# Patient Record
Sex: Female | Born: 1970 | Hispanic: No | Smoking: Current every day smoker
Health system: Southern US, Community
[De-identification: ages and names within clinical notes are randomized; demographics above are authoritative.]

## PROBLEM LIST (undated history)

## (undated) ENCOUNTER — Ambulatory Visit: Discharge status: Absent without leave | Payer: 59

## (undated) DIAGNOSIS — E079 Disorder of thyroid, unspecified: Secondary | ICD-10-CM

## (undated) DIAGNOSIS — J984 Other disorders of lung: Secondary | ICD-10-CM

## (undated) DIAGNOSIS — M797 Fibromyalgia: Secondary | ICD-10-CM

## (undated) DIAGNOSIS — G35D Multiple sclerosis, unspecified: Secondary | ICD-10-CM

## (undated) DIAGNOSIS — C159 Malignant neoplasm of esophagus, unspecified: Secondary | ICD-10-CM

## (undated) DIAGNOSIS — J449 Chronic obstructive pulmonary disease, unspecified: Secondary | ICD-10-CM

## (undated) DIAGNOSIS — G35 Multiple sclerosis: Secondary | ICD-10-CM

## (undated) DIAGNOSIS — I10 Essential (primary) hypertension: Secondary | ICD-10-CM

## (undated) DIAGNOSIS — E119 Type 2 diabetes mellitus without complications: Secondary | ICD-10-CM

## (undated) DIAGNOSIS — J45909 Unspecified asthma, uncomplicated: Secondary | ICD-10-CM

## (undated) HISTORY — DX: Chronic obstructive pulmonary disease, unspecified: J44.9

## (undated) HISTORY — PX: ABDOMINAL HYSTERECTOMY: SHX81

## (undated) HISTORY — PX: NISSEN FUNDOPLICATION: SHX2091

## (undated) HISTORY — DX: Other disorders of lung: J98.4

## (undated) HISTORY — DX: Unspecified asthma, uncomplicated: J45.909

---

## 2001-05-30 ENCOUNTER — Other Ambulatory Visit: Admission: RE | Admit: 2001-05-30 | Discharge: 2001-05-30 | Payer: Self-pay | Admitting: Obstetrics and Gynecology

## 2004-10-13 ENCOUNTER — Emergency Department: Payer: Self-pay | Admitting: Internal Medicine

## 2006-07-19 ENCOUNTER — Other Ambulatory Visit: Payer: Self-pay

## 2006-07-19 ENCOUNTER — Emergency Department: Payer: Self-pay | Admitting: Internal Medicine

## 2006-07-24 ENCOUNTER — Emergency Department: Payer: Self-pay | Admitting: Unknown Physician Specialty

## 2008-06-14 ENCOUNTER — Emergency Department: Payer: Self-pay | Admitting: Internal Medicine

## 2009-05-02 ENCOUNTER — Emergency Department: Payer: Self-pay | Admitting: Emergency Medicine

## 2010-03-22 ENCOUNTER — Emergency Department: Payer: Self-pay | Admitting: Emergency Medicine

## 2010-09-23 ENCOUNTER — Emergency Department: Payer: Self-pay | Admitting: Emergency Medicine

## 2011-01-05 ENCOUNTER — Emergency Department: Payer: Self-pay | Admitting: Emergency Medicine

## 2011-07-22 ENCOUNTER — Emergency Department: Payer: Self-pay | Admitting: Emergency Medicine

## 2011-09-14 ENCOUNTER — Emergency Department: Payer: Self-pay | Admitting: Emergency Medicine

## 2011-11-05 ENCOUNTER — Emergency Department: Payer: Self-pay | Admitting: Emergency Medicine

## 2011-11-29 ENCOUNTER — Encounter: Payer: Self-pay | Admitting: Obstetrics and Gynecology

## 2011-11-29 LAB — T4, FREE: Free Thyroxine: 1.09 ng/dL (ref 0.76–1.46)

## 2011-12-31 ENCOUNTER — Encounter: Payer: Self-pay | Admitting: Obstetrics and Gynecology

## 2012-03-07 DIAGNOSIS — M797 Fibromyalgia: Secondary | ICD-10-CM | POA: Insufficient documentation

## 2012-03-07 DIAGNOSIS — G43909 Migraine, unspecified, not intractable, without status migrainosus: Secondary | ICD-10-CM | POA: Insufficient documentation

## 2012-03-07 DIAGNOSIS — K589 Irritable bowel syndrome without diarrhea: Secondary | ICD-10-CM | POA: Insufficient documentation

## 2012-03-07 DIAGNOSIS — G35 Multiple sclerosis: Secondary | ICD-10-CM | POA: Insufficient documentation

## 2012-03-07 DIAGNOSIS — K224 Dyskinesia of esophagus: Secondary | ICD-10-CM | POA: Insufficient documentation

## 2012-03-07 DIAGNOSIS — J45909 Unspecified asthma, uncomplicated: Secondary | ICD-10-CM | POA: Insufficient documentation

## 2012-05-10 ENCOUNTER — Observation Stay: Payer: Self-pay | Admitting: Obstetrics and Gynecology

## 2012-05-11 LAB — URINALYSIS, COMPLETE
Nitrite: POSITIVE
Ph: 6 (ref 4.5–8.0)
WBC UR: 7 /HPF (ref 0–5)

## 2012-05-27 DIAGNOSIS — Z98891 History of uterine scar from previous surgery: Secondary | ICD-10-CM | POA: Insufficient documentation

## 2012-06-03 ENCOUNTER — Emergency Department: Payer: Self-pay

## 2012-06-03 LAB — CBC
MCHC: 31.4 g/dL — ABNORMAL LOW (ref 32.0–36.0)
Platelet: 281 10*3/uL (ref 150–440)
RDW: 18 % — ABNORMAL HIGH (ref 11.5–14.5)
WBC: 15.4 10*3/uL — ABNORMAL HIGH (ref 3.6–11.0)

## 2012-06-03 LAB — BASIC METABOLIC PANEL
BUN: 6 mg/dL — ABNORMAL LOW (ref 7–18)
Calcium, Total: 8.6 mg/dL (ref 8.5–10.1)
Co2: 25 mmol/L (ref 21–32)
EGFR (African American): >60
Potassium: 3.1 mmol/L — ABNORMAL LOW (ref 3.5–5.1)

## 2012-06-03 LAB — HEPATIC FUNCTION PANEL A (ARMC)
Albumin: 2.3 g/dL — ABNORMAL LOW (ref 3.4–5.0)
Alkaline Phosphatase: 101 U/L (ref 50–136)
Bilirubin, Direct: <0.1 mg/dL (ref 0.00–0.20)
Bilirubin,Total: 0.2 mg/dL (ref 0.2–1.0)
SGPT (ALT): 13 U/L (ref 12–78)

## 2012-06-03 LAB — PRO B NATRIURETIC PEPTIDE: B-Type Natriuretic Peptide: 185 pg/mL — ABNORMAL HIGH (ref 0–125)

## 2012-06-06 DIAGNOSIS — J449 Chronic obstructive pulmonary disease, unspecified: Secondary | ICD-10-CM | POA: Insufficient documentation

## 2012-06-06 DIAGNOSIS — M549 Dorsalgia, unspecified: Secondary | ICD-10-CM | POA: Insufficient documentation

## 2012-06-06 DIAGNOSIS — D649 Anemia, unspecified: Secondary | ICD-10-CM | POA: Insufficient documentation

## 2012-06-06 DIAGNOSIS — IMO0002 Reserved for concepts with insufficient information to code with codable children: Secondary | ICD-10-CM | POA: Insufficient documentation

## 2012-06-06 DIAGNOSIS — F319 Bipolar disorder, unspecified: Secondary | ICD-10-CM | POA: Insufficient documentation

## 2012-06-06 DIAGNOSIS — R569 Unspecified convulsions: Secondary | ICD-10-CM | POA: Insufficient documentation

## 2012-06-06 DIAGNOSIS — F419 Anxiety disorder, unspecified: Secondary | ICD-10-CM | POA: Insufficient documentation

## 2012-06-09 LAB — CULTURE, BLOOD (SINGLE)

## 2012-07-14 ENCOUNTER — Emergency Department: Payer: Self-pay | Admitting: Emergency Medicine

## 2012-07-24 DIAGNOSIS — I10 Essential (primary) hypertension: Secondary | ICD-10-CM | POA: Insufficient documentation

## 2012-07-27 ENCOUNTER — Emergency Department: Payer: Self-pay | Admitting: Emergency Medicine

## 2012-07-31 LAB — WOUND AEROBIC CULTURE

## 2012-08-06 DIAGNOSIS — E119 Type 2 diabetes mellitus without complications: Secondary | ICD-10-CM | POA: Insufficient documentation

## 2012-08-13 ENCOUNTER — Emergency Department: Payer: Self-pay | Admitting: Emergency Medicine

## 2012-10-06 DIAGNOSIS — Z7689 Persons encountering health services in other specified circumstances: Secondary | ICD-10-CM | POA: Insufficient documentation

## 2012-12-03 DIAGNOSIS — Z79891 Long term (current) use of opiate analgesic: Secondary | ICD-10-CM | POA: Insufficient documentation

## 2013-02-08 ENCOUNTER — Emergency Department: Payer: Self-pay | Admitting: Emergency Medicine

## 2013-02-09 ENCOUNTER — Emergency Department: Payer: Self-pay | Admitting: Emergency Medicine

## 2013-02-09 LAB — CBC
HCT: 33.4 % — ABNORMAL LOW (ref 35.0–47.0)
MCH: 24.5 pg — ABNORMAL LOW (ref 26.0–34.0)
MCHC: 31.6 g/dL — ABNORMAL LOW (ref 32.0–36.0)
Platelet: 287 10*3/uL (ref 150–440)
RDW: 19.6 % — ABNORMAL HIGH (ref 11.5–14.5)

## 2013-02-09 LAB — COMPREHENSIVE METABOLIC PANEL
Albumin: 3.6 g/dL (ref 3.4–5.0)
Alkaline Phosphatase: 46 U/L — ABNORMAL LOW (ref 50–136)
BUN: 7 mg/dL (ref 7–18)
Bilirubin,Total: 0.2 mg/dL (ref 0.2–1.0)
Chloride: 112 mmol/L — ABNORMAL HIGH (ref 98–107)
Creatinine: 0.72 mg/dL (ref 0.60–1.30)
EGFR (African American): >60
EGFR (Non-African Amer.): >60
Osmolality: 279 (ref 275–301)
Potassium: 3.6 mmol/L (ref 3.5–5.1)
SGOT(AST): 20 U/L (ref 15–37)
Sodium: 141 mmol/L (ref 136–145)
Total Protein: 8 g/dL (ref 6.4–8.2)

## 2013-02-12 DIAGNOSIS — Z720 Tobacco use: Secondary | ICD-10-CM | POA: Insufficient documentation

## 2013-02-12 DIAGNOSIS — F1920 Other psychoactive substance dependence, uncomplicated: Secondary | ICD-10-CM | POA: Insufficient documentation

## 2013-03-04 DIAGNOSIS — T7491XA Unspecified adult maltreatment, confirmed, initial encounter: Secondary | ICD-10-CM | POA: Insufficient documentation

## 2013-03-04 DIAGNOSIS — IMO0002 Reserved for concepts with insufficient information to code with codable children: Secondary | ICD-10-CM | POA: Insufficient documentation

## 2013-04-15 ENCOUNTER — Ambulatory Visit: Payer: Self-pay | Admitting: Pain Medicine

## 2013-04-16 ENCOUNTER — Emergency Department: Payer: Self-pay | Admitting: Unknown Physician Specialty

## 2013-11-02 ENCOUNTER — Emergency Department: Payer: Self-pay | Admitting: Emergency Medicine

## 2013-11-03 ENCOUNTER — Ambulatory Visit: Payer: Self-pay | Admitting: Family Medicine

## 2014-08-30 ENCOUNTER — Ambulatory Visit: Payer: Self-pay

## 2014-09-02 ENCOUNTER — Emergency Department: Payer: Self-pay | Admitting: Emergency Medicine

## 2014-09-08 ENCOUNTER — Telehealth: Payer: Self-pay | Admitting: Endocrinology

## 2014-09-08 ENCOUNTER — Ambulatory Visit: Payer: Self-pay | Admitting: Endocrinology

## 2014-09-08 NOTE — Telephone Encounter (Signed)
Follow up advised. Contact patient and schedule visit in _1-2___weeks 

## 2014-09-08 NOTE — Telephone Encounter (Signed)
Patient no showed today's appt. Please advise on how to follow up. °A. No follow up necessary. °B. Follow up urgent. Contact patient immediately. °C. Follow up necessary. Contact patient and schedule visit in ___ days. °D. Follow up advised. Contact patient and schedule visit in ____weeks. ° °

## 2014-09-09 NOTE — Telephone Encounter (Signed)
Unable to reach patient at number provided

## 2014-09-10 NOTE — Telephone Encounter (Signed)
No show letter sent.

## 2014-11-29 ENCOUNTER — Ambulatory Visit: Payer: Self-pay | Admitting: Endocrinology

## 2014-12-06 ENCOUNTER — Ambulatory Visit: Payer: Self-pay | Admitting: Endocrinology

## 2014-12-13 ENCOUNTER — Ambulatory Visit: Payer: Self-pay | Admitting: Physician Assistant

## 2014-12-17 ENCOUNTER — Ambulatory Visit: Payer: Self-pay | Admitting: Registered Nurse

## 2015-01-10 ENCOUNTER — Ambulatory Visit
Admit: 2015-01-10 | Disposition: A | Discharge status: Home / Self Care | Payer: Self-pay | Attending: Family Medicine | Admitting: Family Medicine

## 2015-01-13 ENCOUNTER — Emergency Department
Admit: 2015-01-13 | Disposition: A | Discharge status: Home / Self Care | Payer: Self-pay | Admitting: Emergency Medicine

## 2015-01-30 NOTE — Consult Note (Signed)
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, fibromyalgia ,advancing maternal age, smoker, kidney stones, asthma, cesarean section x 4, h/o gestational diabetes    Referring Physician [facility]; Dr Renford Dills GI at [facility]    Prenatal Hx 44 yo Lacey May [id number] WF was recently re-married and became pregnant u/s on 2/1 =5w 5d -h/o Nissen fundoplication  0240 due to GERD, she has continued esophageal problems  required feeding tube for 6 months 8 years ago when her weight dropped to 70 lbs saw Dr Adria Devon at [facility] in 2000 he diagnosed esophageal spasm versus emotional overlay, he is scheduled to see her in follow up - fibromyalgia used neurontin self d/cd followed by her fam med physician at [facility] -h/o renal stones - it was how she knew she was pregnant , frequent UTI  -asthma uses inhaler -smoker 2-3 /day failed electronic cigarette, nicotine patch and chantix  -cesarean x 4 - advanced maternal age met with gen counselor    Past Obstetrical Hx 1988- breech cesarean healthy female Chlamydia -GDM  1990 repeat cesarean healthy female- GDM  1991 repeat cesarean haelthy female no GDM but baby weighed 9 lb 12 oz 1997 repeat cesarean female Vonna Kotyk - GI problems in nursery continued problems followed by [facility] h/o ectopic and sab   Home Medications: Medication Instructions Status  Zofran ODT 4 mg tablet, disintegrating 1 tab(s) orally 3 times a day, As Needed for nausea Active  Percocet 5/325 325 mg-5 mg tablet 1 tab(s) orally every 6 hours, As Needed for pain Active  albuterol CFC free 90 mcg/inh aerosol  Active  Vicodin tablet 500 mg-5 mg 1 - 2 tab(s) orally every 6 hours as needed   Active  Prenatal Multivitamins oral tablet 1 tab(s) orally once a day Active  Nexium 40 mg oral delayed release capsule 1 cap(s) orally 2 times a day Active   Allergies:   Penicillin: Anaphylaxis  Serzone: Chest Tightness, Tachycardia  Benzoyl Peroxide: Blisters  Milk:  N/V/Diarrhea  beef: GI Distress  Chicken or poultry: GI Distress  Vital Signs/Notes:  Nursing Vital Signs: **Vital Signs.:   25-Mar-13 10:34   Vital Signs Type Routine   Temperature Temperature (F) 99   Celsius 37.2   Temperature Source oral   Pulse Pulse 95   Pulse source per Dinamap   Respirations Respirations 12   Systolic BP Systolic BP 973   Diastolic BP (mmHg) Diastolic BP (mmHg) 63   Mean BP 76   BP Source Dinamap   Perinatal Consult:   PMed Hx Rubella Immune, Hx of varicella    Past Medical History cont'd As above  -GI problems followed by Dr Adria Devon at [facility] last seen 2000- h/o NIssen fundoplication  5329 due to GERD, she has continued esophageal problems requires narcotics vicodin with every meal required feeding tube for 6 months 8 years ago when her weight dropped to 70 lbs , has used Nitroglycerine patch and cardizem in past off now  - h/o HTN off meds normotensive today  -migraines - fibromyalgia used neurontin self d/cd when found out she was pregnant  -h/o renal stones -  frequent UTI  -asthma uses inhaler -smoker 2-3 /day failed electronic cigarette, nicotine patch and chantix  -cesarean x 4 - advanced maternal age met with gen counselor    PSurg Hx c/s x4 , fundoplication    FHx son with multiple problems    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    Soc Hx married,  recent break up got back together seeing  counselor , denies DV   Review Of Systems:   Subjective continued exhaustion , pain with eating, stress related to loss of unemployment benefits    Tolerating Diet No    Medications/Allergies Reviewed Medications/Allergies reviewed   Exam:   Today's Weight 134 up 14    Abdomen soft, FHR 150 , fundus half way to  umbilicus   Impression/Recommendations:   Impression IUP at 14 6/7 by [facility] scan - with several medical issues Serious GI issues with pain with oral intake requiring narcotics, h/o feeding tube- her fam med has  managed her narcotic requirements in past - she has been referred to pain clinic and has an ppt on 01/10/12  Fibromyalgia pain c/o worsening pain - she reports prednisone has helped in past - I rxd a taper pack today 40 mg taper over 2 weeks - neurontin/gabapentin is a reasonable management option if effective for her  Advancing maternal age- desires u/s only  prior cesarean x 4 Smoker h/o HTN h/o GDM    Recommendations I am encouraged by her weight gain ! I gave the patient #14 oxycodone to assist her until her 01/10/12 pain mgt appt- I was clear that I will not  Rx her narcotic maintenance during pregnancy   She will need consultation with neonatology around delivery to discuss opiate withdrawal care for baby  Records from [facility] were not received - I spoke with Dr Adria Devon directly saw her in 2000 and i encouraged pt to contact them about her pregnancy since likely to have increasing problems nitro patch and cardizem may be acceptable if they work for her Neurontin can be used if she decompensates further wrt pain control  follow weight gain, monitor random blood sugar at visits - unlikley to tolerate glucola -  (I did not check today)   Plan:   Genetic Counseling yes, done previously    Prenatal Diagnosis Options First trimester, Level II [place]    Ultrasound at what gestational ages Monthly >24 weeks    Delivery Mode Cesarean, repeat #4    Delivery at what gestational age [redacted] weeks     Total Time Spent with Patient 15 minutes    >50% of visit spent in couseling/coordination of care yes    Office Use Only 99213  Office Visit Level 3 (65mn) EST exp prob focused outpt   Coding Description: MATERNAL CONDITIONS/HISTORY INDICATION(S).   Drug Use:  Opioids/Opiates or Codeine/Methadone.   Diabetes - Gestational GDM.   History of prior preg w/ Macrosomia.   OTHER: GI disorder/fibromyalgia.  Electronic Signatures: LSharyn Creamer(MD)  (Signed 25-Mar-13  12:36)  Authored: Referral, Home Medications, Allergies, Vital Signs/Notes, Consult, Exam, Impression, Plan, Billing, Coding Description   Last Updated: 25-Mar-13 12:36 by LSharyn Creamer(MD)

## 2015-01-30 NOTE — Consult Note (Signed)
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, fibromyalgia ,advancing maternal age, smoker, kidney stones, asthma, cesarean section x 4, h/o gestational diabetes    Referring Physician [facility]; Dr Vicenta Aly GI at [facility]    Prenatal Hx 44 yo [place] [id number] WF was recently re-married and became pregnant u/s on 2/1 =5w 5d -h/o Nissen fundoplication  9432 due to GERD, she has continued esophagealesophageal problems requires narcotics vicodinwith every meal required feeding tube for 6 months 8 years ago when her weight dropped to 70 lbs  - fibromyalgia used neurontin self d/cd -h/o renal stones - it was how she knew she was pregnant , frequent UTI  -asthma uses inhaler -smoker 2-3 /day failed electronic cigarette, nicotine patch and chantix  -cesarean x 4 - advanced maternal age met with gen counselor    Past Obstetrical Hx 1988- breech cesarean healthy female Chlamydia -GDM  1990 repeat cesarean healthy female- GDM  1991 repeat cesarean haelthy female no GDM but baby weighed 9 lb 12 oz 1997 repeat cesarean female Vonna Kotyk - GI problems in nursery continued problems followed by [facility] h/o ectopic and sab   Home Medications: Medication Instructions Status  Zofran ODT 4 mg tablet, disintegrating 1 tab(s) orally 3 times a day, As Needed for nausea Active  Percocet 5/325 325 mg-5 mg tablet 1 tab(s) orally every 6 hours, As Needed for pain Active  albuterol CFC free 90 mcg/inh aerosol  Active  Vicodin tablet 500 mg-5 mg 1 - 2 tab(s) orally every 6 hours as needed   Active  Prenatal Multivitamins oral tablet 1 tab(s) orally once a day Active  Nexium 40 mg oral delayed release capsule 1 cap(s) orally 2 times a day Active   Allergies:   Penicillin: Anaphylaxis  Serzone: Chest Tightness, Tachycardia  Benzoyl Peroxide: Blisters  Milk: N/V/Diarrhea  beef: GI Distress  Chicken or poultry: GI Distress  Vital Signs/Notes:  Nursing Vital Signs: **Vital Signs.:  21-Feb-13 13:06   Vital Signs Type Routine   Temperature Temperature (F) 99.7   Celsius 37.6   Temperature Source oral   Pulse Pulse 83   Pulse source per Dinamap   Respirations Respirations 12   Systolic BP Systolic BP 761   Diastolic BP (mmHg) Diastolic BP (mmHg) 68   Mean BP 85   BP Source Dinamap   Perinatal Consult:   PMed Hx Rubella Immune, Hx of varicella    Past Medical History cont'd As above  -GI problems followed by Dr Myrtis Ser at [facility] - h/o NIssen fundoplication  4709 due to GERD, she has continued esophagealesophageal problems requires narcotics vicodin with every meal required feeding tube for 6 months 8 years ago when her weight dropped to 70 lbs , has used Nitroglycerine patch and cardizem in past off now  - h/o HTN off meds normotensive today  -migraines - fibromyalgia used neurontin self d/cd when found out she was pregnant  -h/o renal stones -  frequent UTI  -asthma uses inhaler -smoker 2-3 /day failed electronic cigarette, nicotine patch and chantix  -cesarean x 4 - advanced maternal age met with gen counselor    PSurg Hx c/s x4 , fundoplication    FHx son with multiple problems    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    Soc Hx married   Review Of Systems:   Subjective exhaustion , pain with eating    Tolerating Diet No   Exam:   Today's Weight 122 lbs  Blood Glucose:  21-Feb-13 15:25    POCT Blood Glucose 84   Impression/Recommendations:   Impression IUP at 8 6/7 by [facility] - Pt expresses strong desire to continue pregnancy despite mutliple problems Serious GI issues with pain with oral intake requiring narcotics, h/o feeding tube- her fam med ahs managed her narcotic requirements Fibromyalgia pain c/o pain desires to restart neurontin Advancing maternal age prior cesarean x 4 Smoker h/o HTN h/o GDM    Recommendations I gave the patient #40 oxycodone and referred her to pain management  I will request records from  [facility] and i encouraged pt to contact them about her pregnancy since likely to have increasing problems nitro patch and cardizem may be acceptable if they work for her pt met with the genetic counselor to discuss meds and Advancing maternal age  refill on Neurontin and instructions on re-starting given follow weight gain, monitor random blood sugar at visits - unlikley to tolerate glucola Obtain baseline p/c and chemistries , EKG (not ordered today)   Plan:   Genetic Counseling yes, done today    Prenatal Diagnosis Options First trimester, Level II [place]    Ultrasound at what gestational ages Monthly >24 weeks    Delivery Mode Cesarean    Additional Testing Thyroid panel    Delivery at what gestational age [redacted] weeks     Total Time Spent with Patient 30 minutes    >50% of visit spent in couseling/coordination of care yes    Office Use Only 99242  Level 2 (24mn) NEW office consult exp prob focused   Coding Description: MATERNAL CONDITIONS/HISTORY INDICATION(S).   Adv. Maternal Age - multigravida.   Drug Use:  Opioids/Opiates or Codeine/Methadone.   Previous C-section.  Electronic Signatures: LSharyn Creamer(MD)  (Signed 21-Feb-13 16:35)  Authored: Referral, Home Medications, Allergies, Vital Signs/Notes, Consult, Exam, Lab, Impression, Plan, Billing, Coding Description   Last Updated: 21-Feb-13 16:35 by LSharyn Creamer(MD)

## 2015-02-02 ENCOUNTER — Ambulatory Visit (INDEPENDENT_AMBULATORY_CARE_PROVIDER_SITE_OTHER): Payer: Self-pay | Admitting: Endocrinology

## 2015-02-02 ENCOUNTER — Encounter: Payer: Self-pay | Admitting: Endocrinology

## 2015-02-02 VITALS — BP 132/86 | HR 84 | Temp 98.1°F | Ht 64.5 in | Wt 119.0 lb

## 2015-02-02 DIAGNOSIS — R002 Palpitations: Secondary | ICD-10-CM | POA: Insufficient documentation

## 2015-02-02 NOTE — Patient Instructions (Signed)
blood tests, and a 24-HR urine test, are requested for you today.  We'll let you know about the results. If these are normal, no further hormonal testing is needed.

## 2015-02-02 NOTE — Progress Notes (Signed)
Subjective:    Patient ID: Lacey May, female    DOB: 1971/09/01, 44 y.o.   MRN: [id number]  HPI Pt states 6 months of moderate palpitations in the chest, and assoc doe.  She has no h/o thyroid problems.  No EtOH or cocaine use. No past medical history on file.  No past surgical history on file.  History   Social History  . Marital Status: Married    Spouse Name: N/A  . Number of Children: N/A  . Years of Education: N/A   Occupational History  . Not on file.   Social History Main Topics  . Smoking status: Unknown If Ever Smoked  . Smokeless tobacco: Not on file  . Alcohol Use: Not on file  . Drug Use: Not on file  . Sexual Activity: Not on file   Other Topics Concern  . Not on file   Social History Narrative  . No narrative on file    No current outpatient prescriptions on file prior to visit.   No current facility-administered medications on file prior to visit.    Allergies  Allergen Reactions  . Penicillins Anaphylaxis    Other reaction(s): SHORTNESS OF BREATH  . Nefazodone Other (See Comments)    Tachacardia Other reaction(s): OTHER  . Benzoyl Peroxide Other (See Comments) and Rash    Family History  Problem Relation Age of Onset  . Adopted: Yes    BP 132/86 mmHg  Pulse 84  Temp(Src) 98.1 F (36.7 C) (Oral)  Ht 5' 4.5" (1.638 m)  Wt 119 lb (53.978 kg)  BMI 20.12 kg/m2  SpO2 97%    Review of Systems She has fatigue, tremor, anxiety, headache, rhinorrhea, easy bruising, urinary frequency, excessive diaphoresis, heat intolerance, and diffuse myalgias.  denies weight change, hoarseness, double vision, diarrhea, and numbness.  She has no menses x 1 year.    Objective:   Physical Exam VS: see vs page GEN: no distress HEAD: head: no deformity eyes: no periorbital swelling, no proptosis external nose and ears are normal mouth: no lesion seen NECK: supple, thyroid is not enlarged CHEST WALL: no deformity LUNGS:  Clear to auscultation.    CV: reg rate and rhythm, no murmur ABD: abdomen is soft, nontender.  no hepatosplenomegaly.  not distended.  no hernia MUSCULOSKELETAL: muscle bulk and strength are grossly normal.  no obvious joint swelling.  gait is normal and steady EXTEMITIES: no deformity.  no ulcer on the feet.  feet are of normal color and temp.  no edema PULSES: dorsalis pedis intact bilat.  no carotid bruit NEURO:  cn 2-12 grossly intact.   readily moves all 4's.  sensation is intact to touch on the feet SKIN:  Normal texture and temperature.  No rash or suspicious lesion is visible.   NODES:  None palpable at the neck PSYCH: alert, well-oriented.  Does not appear anxious nor depressed.    (pt says LH was 70 last year). Lab Results  Component Value Date   TSH 0.32* 02/02/2015  free T4 is slightly low also.     Assessment & Plan:  Palpitations, new, uncertain etiology. Several mild abnormalities of TFT, uncertain etiology. Menopause: this might contribute to sxs.  Patient is advised the following: Patient Instructions  blood tests, and a 24-HR urine test, are requested for you today.  We'll let you know about the results. If these are normal, no further hormonal testing is needed.   Addendum: pt is advised to recheck TFT in a few  weeks.

## 2015-02-03 LAB — T4, FREE: Free T4: 0.56 ng/dL — ABNORMAL LOW (ref 0.60–1.60)

## 2015-02-03 LAB — TSH: TSH: 0.32 u[IU]/mL — AB (ref 0.35–4.50)

## 2015-02-09 ENCOUNTER — Other Ambulatory Visit: Payer: Self-pay

## 2015-02-09 DIAGNOSIS — R002 Palpitations: Secondary | ICD-10-CM

## 2015-03-01 ENCOUNTER — Other Ambulatory Visit: Payer: Self-pay

## 2015-03-02 ENCOUNTER — Encounter: Payer: Self-pay | Admitting: Family Medicine

## 2015-03-02 ENCOUNTER — Ambulatory Visit
Admission: EM | Admit: 2015-03-02 | Discharge: 2015-03-02 | Disposition: A | Discharge status: Home / Self Care | Payer: Medicaid Other | Attending: Family Medicine | Admitting: Family Medicine

## 2015-03-02 DIAGNOSIS — M7989 Other specified soft tissue disorders: Secondary | ICD-10-CM | POA: Diagnosis present

## 2015-03-02 DIAGNOSIS — E119 Type 2 diabetes mellitus without complications: Secondary | ICD-10-CM | POA: Insufficient documentation

## 2015-03-02 DIAGNOSIS — Z79899 Other long term (current) drug therapy: Secondary | ICD-10-CM | POA: Diagnosis not present

## 2015-03-02 DIAGNOSIS — E079 Disorder of thyroid, unspecified: Secondary | ICD-10-CM | POA: Diagnosis not present

## 2015-03-02 DIAGNOSIS — Z8501 Personal history of malignant neoplasm of esophagus: Secondary | ICD-10-CM | POA: Insufficient documentation

## 2015-03-02 DIAGNOSIS — M797 Fibromyalgia: Secondary | ICD-10-CM | POA: Insufficient documentation

## 2015-03-02 DIAGNOSIS — I10 Essential (primary) hypertension: Secondary | ICD-10-CM | POA: Insufficient documentation

## 2015-03-02 DIAGNOSIS — R42 Dizziness and giddiness: Secondary | ICD-10-CM | POA: Diagnosis present

## 2015-03-02 DIAGNOSIS — G35 Multiple sclerosis: Secondary | ICD-10-CM | POA: Diagnosis not present

## 2015-03-02 DIAGNOSIS — R51 Headache: Secondary | ICD-10-CM | POA: Diagnosis present

## 2015-03-02 DIAGNOSIS — F17210 Nicotine dependence, cigarettes, uncomplicated: Secondary | ICD-10-CM | POA: Diagnosis not present

## 2015-03-02 DIAGNOSIS — H6983 Other specified disorders of Eustachian tube, bilateral: Secondary | ICD-10-CM | POA: Diagnosis not present

## 2015-03-02 DIAGNOSIS — R600 Localized edema: Secondary | ICD-10-CM

## 2015-03-02 HISTORY — DX: Fibromyalgia: M79.7

## 2015-03-02 HISTORY — DX: Multiple sclerosis: G35

## 2015-03-02 HISTORY — DX: Essential (primary) hypertension: I10

## 2015-03-02 HISTORY — DX: Malignant neoplasm of esophagus, unspecified: C15.9

## 2015-03-02 HISTORY — DX: Type 2 diabetes mellitus without complications: E11.9

## 2015-03-02 HISTORY — DX: Multiple sclerosis, unspecified: G35.D

## 2015-03-02 HISTORY — DX: Disorder of thyroid, unspecified: E07.9

## 2015-03-02 LAB — GLUCOSE, CAPILLARY: Glucose-Capillary: 93 mg/dL (ref 65–99)

## 2015-03-02 LAB — CBG MONITORING, ED: Glucose, Fingerstick: 93

## 2015-03-02 NOTE — Discharge Instructions (Signed)
Monitor diet for low salt- elevate legs- drink only water. Return to see [place] in 24-48 hours if swelling not improved (or See PCP if available). Hydrate/steam  Continue your Zyrtec and Flonase and add Robitussin/guafenesin 1 tsp every 4 hours to help relieve eustachian tube congestion.  RETURN TO SEE [place] OR SEE YOUR PCP in the next 48 hours if swelling not improved. CAll today and schedule foloow up with PCP for next week to have you on their appointmnt books. Barotitis Media Barotitis media is inflammation of your middle ear. This occurs when the auditory tube (eustachian tube) leading from the back of your nose (nasopharynx) to your eardrum is blocked. This blockage may result from a cold, environmental allergies, or an upper respiratory infection. Unresolved barotitis media may lead to damage or hearing loss (barotrauma), which may become permanent. HOME CARE INSTRUCTIONS   Use medicines as recommended by your health care provider. Over-the-counter medicines will help unblock the canal and can help during times of air travel.  Do not put anything into your ears to clean or unplug them. Eardrops will not be helpful.  Do not swim, dive, or fly until your health care provider says it is all right to do so. If these activities are necessary, chewing gum with frequent, forceful swallowing may help. It is also helpful to hold your nose and gently blow to pop your ears for equalizing pressure changes. This forces air into the eustachian tube.  Only take over-the-counter or prescription medicines for pain, discomfort, or fever as directed by your health care provider.  A decongestant may be helpful in decongesting the middle ear and make pressure equalization easier. SEEK MEDICAL CARE IF:  You experience a serious form of dizziness in which you feel as if the room is spinning and you feel nauseated (vertigo).  Your symptoms only involve one ear. SEEK IMMEDIATE MEDICAL CARE IF:   You develop a severe  headache, dizziness, or severe ear pain.  You have bloody or pus-like drainage from your ears.  You develop a fever.  Your problems do not improve or become worse. MAKE SURE YOU:   Understand these instructions.  Will watch your condition.  Will get help right away if you are not doing well or get worse. Document Released: 09/21/2000 Document Revised: 07/15/2013 Document Reviewed: 04/21/2013 [facility] Patient Information 2015 [place], [place]. This information is not intended to replace advice given to you by your health care provider. Make sure you discuss any questions you have with your health care provider. DASH Eating Plan DASH stands for "Dietary Approaches to Stop Hypertension." The DASH eating plan is a healthy eating plan that has been shown to reduce high blood pressure (hypertension). Additional health benefits may include reducing the risk of type 2 diabetes mellitus, heart disease, and stroke. The DASH eating plan may also help with weight loss. WHAT DO I NEED TO KNOW ABOUT THE DASH EATING PLAN? For the DASH eating plan, you will follow these general guidelines:  Choose foods with a percent daily value for sodium of less than 5% (as listed on the food label).  Use salt-free seasonings or herbs instead of table salt or sea salt.  Check with your health care provider or pharmacist before using salt substitutes.  Eat lower-sodium products, often labeled as "lower sodium" or "no salt added."  Eat fresh foods.  Eat more vegetables, fruits, and low-fat dairy products.  Choose whole grains. Look for the word "whole" as the first word in the ingredient list.  Choose fish and skinless chicken or [place] more often than red meat. Limit fish, poultry, and meat to 6 oz (170 g) each day.  Limit sweets, desserts, sugars, and sugary drinks.  Choose heart-healthy fats.  Limit cheese to 1 oz (28 g) per day.  Eat more home-cooked food and less restaurant, buffet, and fast  food.  Limit fried foods.  Cook foods using methods other than frying.  Limit canned vegetables. If you do use them, rinse them well to decrease the sodium.  When eating at a restaurant, ask that your food be prepared with less salt, or no salt if possible. WHAT FOODS CAN I EAT? Seek help from a dietitian for individual calorie needs. Grains Whole grain or whole wheat bread. Brown rice. Whole grain or whole wheat pasta. Quinoa, bulgur, and whole grain cereals. Low-sodium cereals. Corn or whole wheat flour tortillas. Whole grain cornbread. Whole grain crackers. Low-sodium crackers. Vegetables Fresh or frozen vegetables (raw, steamed, roasted, or grilled). Low-sodium or reduced-sodium tomato and vegetable juices. Low-sodium or reduced-sodium tomato sauce and paste. Low-sodium or reduced-sodium canned vegetables.  Fruits All fresh, canned (in natural juice), or frozen fruits. Meat and Other Protein Products Ground beef (85% or leaner), grass-fed beef, or beef trimmed of fat. Skinless chicken or [place]. Ground chicken or [place]. Pork trimmed of fat. All fish and seafood. Eggs. Dried beans, peas, or lentils. Unsalted nuts and seeds. Unsalted canned beans. Dairy Low-fat dairy products, such as skim or 1% milk, 2% or reduced-fat cheeses, low-fat ricotta or cottage cheese, or plain low-fat yogurt. Low-sodium or reduced-sodium cheeses. Fats and Oils Tub margarines without trans fats. Light or reduced-fat mayonnaise and salad dressings (reduced sodium). Avocado. Safflower, olive, or canola oils. Natural peanut or almond butter. Other Unsalted popcorn and pretzels. The items listed above may not be a complete list of recommended foods or beverages. Contact your dietitian for more options. WHAT FOODS ARE NOT RECOMMENDED? Grains White bread. White pasta. White rice. Refined cornbread. Bagels and croissants. Crackers that contain trans fat. Vegetables Creamed or fried vegetables. Vegetables in a  cheese sauce. Regular canned vegetables. Regular canned tomato sauce and paste. Regular tomato and vegetable juices. Fruits Dried fruits. Canned fruit in light or heavy syrup. Fruit juice. Meat and Other Protein Products Fatty cuts of meat. Ribs, chicken wings, bacon, sausage, bologna, salami, chitterlings, fatback, hot dogs, bratwurst, and packaged luncheon meats. Salted nuts and seeds. Canned beans with salt. Dairy Whole or 2% milk, cream, half-and-half, and cream cheese. Whole-fat or sweetened yogurt. Full-fat cheeses or blue cheese. Nondairy creamers and whipped toppings. Processed cheese, cheese spreads, or cheese curds. Condiments Onion and garlic salt, seasoned salt, table salt, and sea salt. Canned and packaged gravies. Worcestershire sauce. Tartar sauce. Barbecue sauce. Teriyaki sauce. Soy sauce, including reduced sodium. Steak sauce. Fish sauce. Oyster sauce. Cocktail sauce. Horseradish. Ketchup and mustard. Meat flavorings and tenderizers. Bouillon cubes. Hot sauce. Tabasco sauce. Marinades. Taco seasonings. Relishes. Fats and Oils Butter, stick margarine, lard, shortening, ghee, and bacon fat. Coconut, palm kernel, or palm oils. Regular salad dressings. Other Pickles and olives. Salted popcorn and pretzels. The items listed above may not be a complete list of foods and beverages to avoid. Contact your dietitian for more information. WHERE CAN I FIND MORE INFORMATION? National Heart, Lung, and Blood Institute: travelstabloid.com Document Released: 09/13/2011 Document Revised: 02/08/2014 Document Reviewed: 07/29/2013 [facility] Patient Information 2015 [place], [place]. This information is not intended to replace advice given to you by your health care provider. Make sure  you discuss any questions you have with your health care provider. ° °

## 2015-03-02 NOTE — ED Notes (Signed)
Patient states she was seen PCP yesterday for BP medication refill. She has been having dizziness, headache, both legs are swollen. She was out of her hypertension medication 6-8 weeks.

## 2015-03-02 NOTE — ED Provider Notes (Signed)
CSN: [id number]     Arrival date & time 03/02/15  1328 History   First MD Initiated Contact with Patient 03/02/15 1421     Chief Complaint  Patient presents with  . Dizziness    today  . Headache    today  . Leg Swelling    both legs   (Consider location/radiation/quality/duration/timing/severity/associated sxs/prior Treatment) HPI  44 yo F reports nasal congestion, hx of seasonal allergies and now her ears feel underwater. Has been using Zyrtec and Flonase. Was seen yesterday at [facility] for routine evaluation; has a long problem list, generally reported as stable. Labs yesterday generally unremarkable-has anemia-being treated. Had been out of her BP Rx for a number of weeks and it was refilled yesterday and restarted. Today she is concerned about significant bilateral leg edema - new onset today- socks leave deep tracks in lower leg above ankle. She is vegetarian and used salted broths to cook yesterday. Smokes. No SOB, no palpitations, no dizziness ( ears feel full from allergies today ) , no chest pain reported. Reports that her BP was high yesterday and" feels high" today. Records indicate 150/78 yesterday  And today 143/78. With questioning denies headache. Has been a bit anxious- marital stress , new home relocation, job issues.  Past Medical History  Diagnosis Date  . Diabetes mellitus without complication   . Hypertension   . Thyroid disease   . Fibromyalgia   . Esophageal cancer   . MS (multiple sclerosis)    Past Surgical History  Procedure Laterality Date  . Cesarean section    . Abdominal hysterectomy     Family History  Problem Relation Age of Onset  . Adopted: Yes  . Cancer Mother   . Heart failure Father   . Hypertension Father   . Lupus Brother    History  Substance Use Topics  . Smoking status: Current Every Day Smoker -- 1.00 packs/day    Types: Cigarettes  . Smokeless tobacco: Not on file  . Alcohol Use: No   OB History    Gravida Para  Term Preterm AB TAB SAB Ectopic Multiple Living   11 11             Review of Systems  Constitutional -afebrile Eyes-denies visual changes ENT-raspy voice,denies sore throat-smoker; has  COPD/asthma intervention Rx CV-denies chest pain Resp-denies SOB GI- negative for nausea,vomiting, diarrhea GU- negative for dysuria MSK- negative for back pain, ambulatory- bilateral lower leg edema noted Skin- denies acute changes Neuro- negative headache,focal weakness or numbness    Allergies  Penicillins; Nefazodone; and Benzoyl peroxide  Home Medications   Prior to Admission medications   Medication Sig Start Date End Date Taking? Authorizing Provider  albuterol (PROVENTIL HFA;VENTOLIN HFA) 108 (90 BASE) MCG/ACT inhaler Frequency:PHARMDIR   Dosage:90   MCG  Instructions:  Note:2 puff q4-6 hrs prn shortness of breath or wheezingm use with spacer. Dose: 90MCG 06/18/11  Yes Historical Provider, MD  beclomethasone (QVAR) 80 MCG/ACT inhaler Inhale into the lungs. 01/11/15 01/11/16 Yes Historical Provider, MD  Blood Glucose Monitoring Suppl (FIFTY50 GLUCOSE METER 2.0) W/DEVICE KIT Use as instructed. 05/06/13  Yes Historical Provider, MD  cetirizine (ZYRTEC) 10 MG tablet Take 10 mg by mouth. 12/11/11  Yes Historical Provider, MD  clonazePAM (KLONOPIN) 0.5 MG tablet Take 0.5 mg by mouth. 09/02/13  Yes Historical Provider, MD  clonazePAM (KLONOPIN) 1 MG disintegrating tablet Take by mouth.   Yes Historical Provider, MD  diltiazem (CARDIZEM CD) 120 MG 24 hr  capsule Take 120 mg by mouth. 12/15/13  Yes Historical Provider, MD  DULoxetine (CYMBALTA) 20 MG capsule Take by mouth. 08/30/14  Yes Historical Provider, MD  DULoxetine (CYMBALTA) 60 MG capsule Take 60 mg by mouth. 03/03/13  Yes Historical Provider, MD  esomeprazole (NEXIUM) 40 MG capsule Take 40 mg by mouth. 03/03/13  Yes Historical Provider, MD  Fluticasone-Salmeterol (ADVAIR) 100-50 MCG/DOSE AEPB Frequency:BID   Dosage:0.0     Instructions:  Note:Dose: 1  06/18/11  Yes Historical Provider, MD  gabapentin (NEURONTIN) 300 MG capsule TAKE 2 CAPSULES BY MOUTH THREE TIMES DAILY 11/17/14  Yes Historical Provider, MD  glucose blood test strip USE TWICE DAILY AS DIRECTED 12/14/13  Yes Historical Provider, MD  HYDROcodone-acetaminophen (NORCO/VICODIN) 5-325 MG per tablet Take 1 tablet by mouth every 6 (six) hours as needed for moderate pain.   Yes Historical Provider, MD  hyoscyamine (LEVSIN, ANASPAZ) 0.125 MG tablet Frequency:TID   Dosage:0.125   MG  Instructions:  Note:Dose: 0.125MG 09/02/13  Yes Historical Provider, MD  ipratropium (ATROVENT HFA) 17 MCG/ACT inhaler Frequency:PHARMDIR   Dosage:0.0     Instructions:  Note:2 puffs q 6 hours Dose: 17MCG/SPRAY 06/18/11  Yes Historical Provider, MD  montelukast (SINGULAIR) 10 MG tablet Take 10 mg by mouth. 01/21/12  Yes Historical Provider, MD  nitroGLYCERIN (NITRODUR - DOSED IN MG/24 HR) 0.2 mg/hr patch Place onto the skin. 05/01/13  Yes Historical Provider, MD  pregabalin (LYRICA) 150 MG capsule Take 150 mg by mouth. 03/25/13  Yes Historical Provider, MD  promethazine (PHENERGAN) 25 MG tablet Take 25 mg by mouth. 12/11/11  Yes Historical Provider, MD  traZODone (DESYREL) 50 MG tablet Take 50 mg by mouth. 03/03/13  Yes Historical Provider, MD   BP 143/78 mmHg  Pulse 87  Temp(Src) 98 F (36.7 C) (Oral)  Resp 18  Ht 5' 1.5" (1.562 m)  Wt 128 lb (58.06 kg)  BMI 23.80 kg/m2  SpO2 100% Physical Exam Constitutional -alert and oriented , appears older than stated age. Has her "most recent" baby with her, a female toddler. Records conflict between 8 and 11 children/pregnancies Head-atraumatic Ears - bilat neg canals and TMs Eyes- conjunctiva normal, EOMI ,conjugate gaze Nose- no congestion or rhinorrhea Mouth/throat- mucous membranes moist ,oropharynx non-erythematous,smokers raspy voice Neck- supple without glandular enlargement CV- regular rate, grossly normal heart sounds,no murmur,gallop or rub noted,good  peripheral pulses Resp-no distress, normal respiratory effort,clear to auscultation bilaterally GI- soft,non-tender,no distention GU-  not examined  MSK- no lower extremity tenderness, ambulatory, 3+ pitting edema from ankle to below knees-exagerated by tight ankle high elastic cuffed  Sport socks, calves negative for tenderness; no color change , good pedal pulses and DTRs Neuro- normal speech and language, no gross focal neurological deficit appreciated, no gait instability Skin-warm,dry ,intact; no rash noted Psych-mood and affect grossly normal; speech and behavior grossly normal ED Course  Procedures (including critical care time) Labs Review Labs Reviewed  GLUCOSE, CAPILLARY  CBG MONITORING, ED   Results for orders placed or performed during the hospital encounter of 03/02/15  Glucose, capillary  Result Value Ref Range   Glucose-Capillary 93 65 - 99 mg/dL  CBG monitoring, ED  Result Value Ref Range   Glucose, Fingerstick 93     Imaging Review No results found.  Patient denied any dizziness while present- refers to it as "clogged up ears" from her sinuses. Encouraged her to use her usual ceterizine daily; add her inhalers and nasal spray as needed. Steamy showers and increased fluids/water encouraged. Needs to monitor  her salt intake which she recognizes has been higher than normal lately. Have deferred intervention with diuretics at this time And will take her out of work with the promise she will elevate her legs and observe for the next 24-48 hours and be seen back for re-evaluation at that time. Holiday weekend and we don't expect her PCP to be open/available. MDM   1. Eustachian tube dysfunction, bilateral   2. Bilateral edema of lower extremity    Plan: 1.  Diagnosis reviewed with patient-seasonal allergies suspected; pedal edema to be followed carefully\ 2. Allergy meds as previously ordered with inhalers as needed.Water hydration/steam. 3. Careful return to her BP  medication schedule and monitor salt/diet. Given DASH handout and low salt foods list. Sofa rest with legs elevated- please try to reduce smoking 4. Recommend supportive treatment with OTC ceterizine/guafenesin/tylenol/ibuprofen/fluids 5. Monitor leg swelling- return in 24-48 hours for re-check and re-evaluation if stable or improving but report to ER with exacerbation of swelling, difficulty  breathing, chest pain , or other concerns.  Questions were invited and responded to . Expectations and recommendations reviewed and patient expresses understanding.    Jan Fireman, PA-C 03/03/15 1750

## 2015-03-03 ENCOUNTER — Encounter: Payer: Self-pay | Admitting: Physician Assistant

## 2015-03-24 ENCOUNTER — Emergency Department
Admission: EM | Admit: 2015-03-24 | Discharge: 2015-03-25 | Disposition: A | Discharge status: Home / Self Care | Payer: BLUE CROSS/BLUE SHIELD | Attending: Emergency Medicine | Admitting: Emergency Medicine

## 2015-03-24 ENCOUNTER — Encounter: Payer: Self-pay | Admitting: Emergency Medicine

## 2015-03-24 DIAGNOSIS — Z880 Allergy status to penicillin: Secondary | ICD-10-CM | POA: Insufficient documentation

## 2015-03-24 DIAGNOSIS — Z7951 Long term (current) use of inhaled steroids: Secondary | ICD-10-CM | POA: Diagnosis not present

## 2015-03-24 DIAGNOSIS — R079 Chest pain, unspecified: Secondary | ICD-10-CM

## 2015-03-24 DIAGNOSIS — K224 Dyskinesia of esophagus: Secondary | ICD-10-CM | POA: Insufficient documentation

## 2015-03-24 DIAGNOSIS — E119 Type 2 diabetes mellitus without complications: Secondary | ICD-10-CM | POA: Insufficient documentation

## 2015-03-24 DIAGNOSIS — I10 Essential (primary) hypertension: Secondary | ICD-10-CM | POA: Diagnosis not present

## 2015-03-24 DIAGNOSIS — Z79899 Other long term (current) drug therapy: Secondary | ICD-10-CM | POA: Diagnosis not present

## 2015-03-24 DIAGNOSIS — Z720 Tobacco use: Secondary | ICD-10-CM | POA: Insufficient documentation

## 2015-03-24 DIAGNOSIS — R0789 Other chest pain: Secondary | ICD-10-CM | POA: Diagnosis not present

## 2015-03-24 DIAGNOSIS — M62838 Other muscle spasm: Secondary | ICD-10-CM | POA: Diagnosis present

## 2015-03-24 NOTE — ED Notes (Signed)
Pt presents to ED with c/o esophogeal spasms that started about an hour ago. Pt states she has hx of the same since the 1990's. Pt states she usually takes muscle relaxer or nitro to relieve her symptoms but pt did not have any medications to help her today. Pt currently has no increased work of breathing or acute distress noted. Reports difficulty swallowing.

## 2015-03-25 ENCOUNTER — Emergency Department: Payer: BLUE CROSS/BLUE SHIELD

## 2015-03-25 DIAGNOSIS — K224 Dyskinesia of esophagus: Secondary | ICD-10-CM | POA: Diagnosis not present

## 2015-03-25 MED ORDER — CYCLOBENZAPRINE HCL 10 MG PO TABS
ORAL_TABLET | ORAL | Status: AC
  Administered 2015-03-25: 10 mg via ORAL
  Filled 2015-03-25: qty 1

## 2015-03-25 MED ORDER — NITROGLYCERIN 0.2 MG/HR TD PT24: 0.2000 mg | MEDICATED_PATCH | Freq: Every day | TRANSDERMAL | Status: DC

## 2015-03-25 MED ORDER — NITROGLYCERIN 0.4 MG SL SUBL
SUBLINGUAL_TABLET | SUBLINGUAL | Status: AC
  Administered 2015-03-25: 0.4 mg via SUBLINGUAL
  Filled 2015-03-25: qty 1

## 2015-03-25 MED ORDER — CYCLOBENZAPRINE HCL 10 MG PO TABS: 10.0000 mg | ORAL_TABLET | Freq: Three times a day (TID) | ORAL | Status: DC | PRN

## 2015-03-25 MED ORDER — NITROGLYCERIN 0.4 MG SL SUBL
0.4000 mg | SUBLINGUAL_TABLET | SUBLINGUAL | Status: DC | PRN
  Administered 2015-03-25: 0.4 mg via SUBLINGUAL

## 2015-03-25 MED ORDER — CYCLOBENZAPRINE HCL 10 MG PO TABS
10.0000 mg | ORAL_TABLET | Freq: Once | ORAL | Status: AC
  Administered 2015-03-25: 10 mg via ORAL

## 2015-03-25 NOTE — ED Provider Notes (Signed)
[facility] Emergency Department Provider Note  ____________________________________________  Time seen: Approximately 12:40 AM I have reviewed the triage vital signs and the nursing notes.   HISTORY  Chief Complaint Spasms    HPI Lacey May is a 44 y.o. female with a history of esophageal spasm who presents today with 1 day of intermittent chest pain which is substernal, squeezing and radiating to her right shoulder and jaw. The patient says that she has had multiple episodes of this in the past from her esophageal spasm and this feels exactly as the pain that she has had previously. She says that eating a lot of food while exacerbate her symptoms which she did today. She has been out of her meds, Flexeril and nitroglycerin for about a week. She is normally seen at [place] [facility] for her conditions. She says that her pain at this time is a 7 out of 10. She says she does have associated shortness of breath when the pain is very bad.   Past Medical History  Diagnosis Date  . Diabetes mellitus without complication   . Hypertension   . Thyroid disease   . Fibromyalgia   . Esophageal cancer   . MS (multiple sclerosis)     Patient Active Problem List   Diagnosis Date Noted  . Palpitations 02/02/2015    Past Surgical History  Procedure Laterality Date  . Cesarean section    . Abdominal hysterectomy      Current Outpatient Rx  Name  Route  Sig  Dispense  Refill  . albuterol (PROVENTIL HFA;VENTOLIN HFA) 108 (90 BASE) MCG/ACT inhaler      Frequency:PHARMDIR   Dosage:90   MCG  Instructions:  Note:2 puff q4-6 hrs prn shortness of breath or wheezingm use with spacer. Dose: 90MCG         . beclomethasone (QVAR) 80 MCG/ACT inhaler   Inhalation   Inhale into the lungs.         . Blood Glucose Monitoring Suppl (FIFTY50 GLUCOSE METER 2.0) W/DEVICE KIT      Use as instructed.         . cetirizine (ZYRTEC) 10 MG tablet   Oral   Take 10 mg by  mouth.         . clonazePAM (KLONOPIN) 0.5 MG tablet   Oral   Take 0.5 mg by mouth.         . clonazePAM (KLONOPIN) 1 MG disintegrating tablet   Oral   Take by mouth.         . diltiazem (CARDIZEM CD) 120 MG 24 hr capsule   Oral   Take 120 mg by mouth.         . DULoxetine (CYMBALTA) 20 MG capsule   Oral   Take by mouth.         . DULoxetine (CYMBALTA) 60 MG capsule   Oral   Take 60 mg by mouth.         . esomeprazole (NEXIUM) 40 MG capsule   Oral   Take 40 mg by mouth.         . Fluticasone-Salmeterol (ADVAIR) 100-50 MCG/DOSE AEPB      Frequency:BID   Dosage:0.0     Instructions:  Note:Dose: 1         . gabapentin (NEURONTIN) 300 MG capsule      TAKE 2 CAPSULES BY MOUTH THREE TIMES DAILY         . glucose blood test strip  USE TWICE DAILY AS DIRECTED         . HYDROcodone-acetaminophen (NORCO/VICODIN) 5-325 MG per tablet   Oral   Take 1 tablet by mouth every 6 (six) hours as needed for moderate pain.         . hyoscyamine (LEVSIN, ANASPAZ) 0.125 MG tablet      Frequency:TID   Dosage:0.125   MG  Instructions:  Note:Dose: 0.125MG         . ipratropium (ATROVENT HFA) 17 MCG/ACT inhaler      Frequency:PHARMDIR   Dosage:0.0     Instructions:  Note:2 puffs q 6 hours Dose: 17MCG/SPRAY         . montelukast (SINGULAIR) 10 MG tablet   Oral   Take 10 mg by mouth.         . nitroGLYCERIN (NITRODUR - DOSED IN MG/24 HR) 0.2 mg/hr patch   Transdermal   Place onto the skin.         . pregabalin (LYRICA) 150 MG capsule   Oral   Take 150 mg by mouth.         . promethazine (PHENERGAN) 25 MG tablet   Oral   Take 25 mg by mouth.         . traZODone (DESYREL) 50 MG tablet   Oral   Take 50 mg by mouth.           Allergies Penicillins; Nefazodone; and Benzoyl peroxide  Family History  Problem Relation Age of Onset  . Adopted: Yes  . Cancer Mother   . Heart failure Father   . Hypertension Father   . Lupus Brother      Social History History  Substance Use Topics  . Smoking status: Current Every Day Smoker -- 1.00 packs/day    Types: Cigarettes  . Smokeless tobacco: Not on file  . Alcohol Use: No    Review of Systems Constitutional: No fever/chills Eyes: No visual changes. ENT: No sore throat. Cardiovascular: As above  Respiratory: As above  Gastrointestinal: No abdominal pain.  No nausea, no vomiting.  No diarrhea.  No constipation. Genitourinary: Negative for dysuria. Musculoskeletal: Negative for back pain. Skin: Negative for rash. Neurological: Negative for headaches, focal weakness or numbness.  10-point ROS otherwise negative.  ____________________________________________   PHYSICAL EXAM:  VITAL SIGNS: ED Triage Vitals  Enc Vitals Group     BP 03/24/15 2035 127/73 mmHg     Pulse Rate 03/24/15 2035 87     Resp 03/24/15 2035 18     Temp 03/24/15 2035 98.5 F (36.9 C)     Temp Source 03/24/15 2035 Oral     SpO2 03/24/15 2035 99 %     Weight 03/24/15 2035 130 lb (58.968 kg)     Height 03/24/15 2035 '5\' 4"'  (1.626 m)     Head Cir --      Peak Flow --      Pain Score 03/24/15 2035 8     Pain Loc --      Pain Edu? --      Excl. in [place]? --     Constitutional: Alert and oriented. Patient is asleep on the midline of the room despite saying that she has 7 out of 10 pain at this time.  Eyes: Conjunctivae are normal. PERRL. EOMI. Head: Atraumatic. Nose: No congestion/rhinnorhea. Mouth/Throat: Mucous membranes are moist.  Oropharynx non-erythematous. Neck: No stridor.   Cardiovascular: Normal rate, regular rhythm. Grossly normal heart sounds.  Good peripheral circulation. Respiratory: Normal respiratory  effort.  No retractions. Lungs CTAB. Right-sided PICC line CDI to the right chest. Gastrointestinal: Soft and nontender. No distention. No abdominal bruits. No CVA tenderness. Musculoskeletal: No lower extremity tenderness nor edema.  No joint effusions. Neurologic:  Normal  speech and language. No gross focal neurologic deficits are appreciated. Speech is normal. No gait instability. Skin:  Skin is warm, dry and intact. No rash noted. Psychiatric: Mood and affect are normal. Speech and behavior are normal.  ____________________________________________   LABS (all labs ordered are listed, but only abnormal results are displayed)  Labs Reviewed  CBC  BASIC METABOLIC PANEL  TROPONIN I   ____________________________________________  EKG  Patient refusing EKG ____________________________________________  RADIOLOGY  Chest x-ray NAD ____________________________________________   PROCEDURES    ____________________________________________   INITIAL IMPRESSION / ASSESSMENT AND PLAN / ED COURSE  Pertinent labs & imaging results that were available during my care of the patient were reviewed by me and considered in my medical decision making (see chart for details).  ----------------------------------------- 1:13 AM on 03/25/2015 -----------------------------------------  Patient refusing labs and EKG. Says that she has had these symptoms multiple times in the past and does not want an excessive hospital bill for supper she notices herself esophageal spasm. She is convinced because she had too much [place] food tonight that she is having the symptoms. I discussed with her that her story is concerning for serious causes of chest pain including heart-related chest pain and that not treating the symptoms adequately could result in serious repercussions such as death. The patient has capacity and is not intoxicated at this time. Will discharge with refills of her prescription for nitroglycerin sublingual as well as Flexeril. Says has a follow-up appointment with her specialist on July 6. We'll discharge to home. Husband also bedside for this explanation. ____________________________________________   FINAL CLINICAL IMPRESSION(S) / ED DIAGNOSES  Acute  chest pain. Acute esophageal spasm. Return visit.    Orbie Pyo, MD 03/25/15 [phone]

## 2015-03-25 NOTE — ED Notes (Signed)
Patient with no complaints at this time. Respirations even and unlabored. Skin warm/dry. Discharge instructions reviewed with patient at this time. Patient given opportunity to voice concerns/ask questions. Patient discharged at this time and left Emergency Department with steady gait.   

## 2015-05-14 ENCOUNTER — Encounter: Payer: Self-pay | Admitting: Emergency Medicine

## 2015-05-14 ENCOUNTER — Ambulatory Visit
Admission: EM | Admit: 2015-05-14 | Discharge: 2015-05-14 | Disposition: A | Discharge status: Home / Self Care | Payer: BLUE CROSS/BLUE SHIELD | Attending: Internal Medicine | Admitting: Internal Medicine

## 2015-05-14 DIAGNOSIS — K111 Hypertrophy of salivary gland: Secondary | ICD-10-CM

## 2015-05-14 DIAGNOSIS — L080 Pyoderma: Secondary | ICD-10-CM

## 2015-05-14 DIAGNOSIS — R220 Localized swelling, mass and lump, head: Secondary | ICD-10-CM

## 2015-05-14 DIAGNOSIS — R609 Edema, unspecified: Principal | ICD-10-CM

## 2015-05-14 DIAGNOSIS — K120 Recurrent oral aphthae: Secondary | ICD-10-CM | POA: Diagnosis not present

## 2015-05-14 MED ORDER — MUPIROCIN 2 % EX OINT: 1.0000 "application " | TOPICAL_OINTMENT | Freq: Three times a day (TID) | CUTANEOUS | Status: DC

## 2015-05-14 MED ORDER — CEPHALEXIN 500 MG PO CAPS: 500.0000 mg | ORAL_CAPSULE | Freq: Two times a day (BID) | ORAL | Status: DC

## 2015-05-14 MED ORDER — CLINDAMYCIN HCL 300 MG PO CAPS: 300.0000 mg | ORAL_CAPSULE | Freq: Three times a day (TID) | ORAL | Status: AC

## 2015-05-14 MED ORDER — TRIAMCINOLONE ACETONIDE 0.1 % MT PSTE: 1.0000 "application " | PASTE | Freq: Three times a day (TID) | OROMUCOSAL | Status: DC

## 2015-05-14 NOTE — ED Provider Notes (Signed)
CSN: [id number]     Arrival date & time 05/14/15  0826 History   First MD Initiated Contact with Patient 05/14/15 [phone]     Chief Complaint  Patient presents with  . Hot Flashes   HPI Patient is a 44 year old lady with multiple medical problems. Today, she is most bothered by hot flashes, which she believes are tactile temperature aggravated by scalp infection. She is also having mouth pain. Saw her PCP, Dr. Norman Clay at [facility] at the end of July she was started on doxycycline for scalp impetigo but says the lesions have been somewhat resistant. Scalp lesions are painful. For the last week, she has had an oral ulcer, became very sore 3 days ago, located on the left upper lateral jaw. She is edentulous. She has dentures but does not wear them. She also has swelling and pain in the glands under her tongue.  Past Medical History  Diagnosis Date  . Diabetes mellitus without complication   . Hypertension   . Thyroid disease   . Fibromyalgia   . Esophageal cancer   . MS (multiple sclerosis)    Past Surgical History  Procedure Laterality Date  . Cesarean section    . Abdominal hysterectomy     Family History  Problem Relation Age of Onset  . Adopted: Yes  . Cancer Mother   . Heart failure Father   . Hypertension Father   . Lupus Brother    History  Substance Use Topics  . Smoking status: Current Every Day Smoker -- 1.00 packs/day    Types: Cigarettes  . Smokeless tobacco: Not on file  . Alcohol Use: No   OB History    Gravida Para Term Preterm AB TAB SAB Ectopic Multiple Living   11 11   0          Review of Systems  All other systems reviewed and are negative.   Allergies  Penicillins; Nefazodone; and Benzoyl peroxide  Home Medications   Prior to Admission medications   Medication Sig Start Date End Date Taking? Authorizing Provider  doxycycline (ADOXA) 100 MG tablet Take 100 mg by mouth 2 (two) times daily.   Yes Historical Provider, MD   albuterol (PROVENTIL HFA;VENTOLIN HFA) 108 (90 BASE) MCG/ACT inhaler Frequency:PHARMDIR   Dosage:90   MCG  Instructions:  Note:2 puff q4-6 hrs prn shortness of breath or wheezingm use with spacer. Dose: 90MCG 06/18/11   Historical Provider, MD  beclomethasone (QVAR) 80 MCG/ACT inhaler Inhale into the lungs. 01/11/15 01/11/16  Historical Provider, MD  Blood Glucose Monitoring Suppl (FIFTY50 GLUCOSE METER 2.0) W/DEVICE KIT Use as instructed. 05/06/13   Historical Provider, MD  cetirizine (ZYRTEC) 10 MG tablet Take 10 mg by mouth. 12/11/11   Historical Provider, MD  clindamycin (CLEOCIN) 300 MG capsule Take 1 capsule (300 mg total) by mouth 3 (three) times daily. 05/14/15 05/21/15  Sherlene Shams, MD  clonazePAM (KLONOPIN) 0.5 MG tablet Take 0.5 mg by mouth. 09/02/13   Historical Provider, MD  clonazePAM (KLONOPIN) 1 MG disintegrating tablet Take by mouth.    Historical Provider, MD  cyclobenzaprine (FLEXERIL) 10 MG tablet Take 1 tablet (10 mg total) by mouth 3 (three) times daily as needed (for esophageal spasm). 03/25/15   Orbie Pyo, MD  diltiazem (CARDIZEM CD) 120 MG 24 hr capsule Take 120 mg by mouth. 12/15/13   Historical Provider, MD  DULoxetine (CYMBALTA) 20 MG capsule Take by mouth. 08/30/14   Historical Provider, MD  DULoxetine (CYMBALTA)  60 MG capsule Take 60 mg by mouth. 03/03/13   Historical Provider, MD  esomeprazole (NEXIUM) 40 MG capsule Take 40 mg by mouth. 03/03/13   Historical Provider, MD  Fluticasone-Salmeterol (ADVAIR) 100-50 MCG/DOSE AEPB Frequency:BID   Dosage:0.0     Instructions:  Note:Dose: 1 06/18/11   Historical Provider, MD  gabapentin (NEURONTIN) 300 MG capsule TAKE 2 CAPSULES BY MOUTH THREE TIMES DAILY 11/17/14   Historical Provider, MD  glucose blood test strip USE TWICE DAILY AS DIRECTED 12/14/13   Historical Provider, MD  HYDROcodone-acetaminophen (NORCO/VICODIN) 5-325 MG per tablet Take 1 tablet by mouth every 6 (six) hours as needed for moderate pain.    Historical  Provider, MD  hyoscyamine (LEVSIN, ANASPAZ) 0.125 MG tablet Frequency:TID   Dosage:0.125   MG  Instructions:  Note:Dose: 0.125MG 09/02/13   Historical Provider, MD  ipratropium (ATROVENT HFA) 17 MCG/ACT inhaler Frequency:PHARMDIR   Dosage:0.0     Instructions:  Note:2 puffs q 6 hours Dose: 17MCG/SPRAY 06/18/11   Historical Provider, MD  montelukast (SINGULAIR) 10 MG tablet Take 10 mg by mouth. 01/21/12   Historical Provider, MD         nitroGLYCERIN (NITRODUR - DOSED IN MG/24 HR) 0.2 mg/hr patch Place 1 patch (0.2 mg total) onto the skin daily. 03/25/15   Orbie Pyo, MD  pregabalin (LYRICA) 150 MG capsule Take 150 mg by mouth. 03/25/13   Historical Provider, MD  promethazine (PHENERGAN) 25 MG tablet Take 25 mg by mouth. 12/11/11   Historical Provider, MD  traZODone (DESYREL) 50 MG tablet Take 50 mg by mouth. 03/03/13   Historical Provider, MD          BP 139/80 mmHg  Pulse 79  Temp(Src) 97.5 F (36.4 C)  Resp 18  Ht '5\' 4"'  (1.626 m)  Wt 112 lb (50.803 kg)  BMI 19.22 kg/m2  SpO2 100% Physical Exam  Constitutional: She is oriented to person, place, and time. No distress.  Somewhat disheveled, napping on the stretcher, somewhat resistant to exam  HENT:  Head: Atraumatic.  Numerous tender shallow crusted lesions on red base 1-2 cm across over the posterior scalp. No golden crusting. Tender to palpation, but none are fluctuant/draining. 22m tender grayish ulcer L upper gingiva, slightly swollen, not draining. Edentulous. Prominent/slightly tender sublingual ducts under tongue, no mass palpated, no drainage expressed.  Eyes:  Conjugate gaze, no eye redness/drainage  Neck: Neck supple.  Cardiovascular: Normal rate.   Pulmonary/Chest: No respiratory distress.  Abdominal: She exhibits no distension.  Musculoskeletal: Normal range of motion.  No leg swelling  Neurological: She is alert and oriented to person, place, and time.  Skin: Skin is warm and dry.  No cyanosis  Nursing note  and vitals reviewed.   ED Course  Procedures  None  MDM   1. Sublingual gland swelling   2. Oral aphthous ulcer   3. Ecthyma    Prescriptions for bactroban ointment and clindamycine (antibiotic) sent to the pharmacy.   These are to help with remaining scalp sores. Prescription for triamcinolone mouth paste sent to pharmacy, to help with sore on L upper jaw.   Swollen/sore glands under tongue may be improved by pushing fluids. Sucking on sour candies is also sometimes helpful. Keep followup appointment with Dr FVickki Muff(towards the end of August).   LSherlene Shams MD 05/14/15 [phone]

## 2015-05-14 NOTE — Discharge Instructions (Signed)
Prescriptions for bactroban ointment and clindamycine (antibiotic) sent to the pharmacy.   These are to help with remaining scalp sores. Prescription for triamcinolone mouth paste sent to pharmacy, to help with sore on L upper jaw.   Swollen/sore glands under tongue may be improved by pushing fluids. Sucking on sour candies is also sometimes helpful. Keep followup appointment with Dr Vickki Muff (towards the end of August).  Canker Sores  Canker sores are painful, open sores on the inside of the mouth and cheek. They may be white or yellow. The sores usually heal in 1 to 2 weeks. Women are more likely than men to have recurrent canker sores. CAUSES The cause of canker sores is not well understood. More than one cause is likely. Canker sores do not appear to be caused by certain types of germs (viruses or bacteria). Canker sores may be caused by:  An allergic reaction to certain foods.  Digestive problems.  Not having enough vitamin G92, folic acid, and iron.  Female sex hormones. Sores may come only during certain phases of a menstrual cycle. Often, there is improvement during pregnancy.  Genetics. Some people seem to inherit canker sore problems. Emotional stress and injuries to the mouth may trigger outbreaks, but not cause them.  DIAGNOSIS Canker sores are diagnosed by exam.  TREATMENT  Patients who have frequent bouts of canker sores may have cultures taken of the sores, blood tests, or allergy tests. This helps determine if their sores are caused by a poor diet, an allergy, or some other preventable or treatable disease.  Vitamins may prevent recurrences or reduce the severity of canker sores in people with poor nutrition.  Numbing ointments can relieve pain. These are available in drug stores without a prescription.  Anti-inflammatory steroid mouth rinses or gels may be prescribed by your caregiver for severe sores.  Oral steroids may be prescribed if you have severe, recurrent  canker sores. These strong medicines can cause many side effects and should be used only under the close direction of a dentist or physician.  Mouth rinses containing the antibiotic medicine may be prescribed. They may lessen symptoms and speed healing. Healing usually happens in about 1 or 2 weeks with or without treatment. Certain antibiotic mouth rinses given to pregnant women and young children can permanently stain teeth. Talk to your caregiver about your treatment. HOME CARE INSTRUCTIONS   Avoid foods that cause canker sores for you.  Avoid citrus juices, spicy or salty foods, and coffee until the sores are healed.  Use a soft-bristled toothbrush.  Chew your food carefully to avoid biting your cheek.  Apply topical numbing medicine to the sore to help relieve pain.  Apply a thin paste of baking soda and water to the sore to help heal the sore.  Only use mouth rinses or medicines for pain or discomfort as directed by your caregiver. SEEK MEDICAL CARE IF:   Your symptoms are not better in 1 week.  Your sores are still present after 2 weeks.  Your sores are very painful.  You have trouble breathing or swallowing.  Your sores come back frequently. Document Released: 01/19/2011 Document Revised: 01/19/2013 Document Reviewed: 01/19/2011 [facility] Patient Information 2015 [place], [place]. This information is not intended to replace advice given to you by your health care provider. Make sure you discuss any questions you have with your health care provider.

## 2015-05-14 NOTE — ED Notes (Signed)
Patient presents here with c/o hot flashes and sweating since 3 days, states that she was recently diagnosed with MRSA and has rashes throughout her body and was seen by her doc , was started onn doxy 30 day course, denies any fever at this time, off and on fever for the past 3 days, c/o pain to the mouth and body

## 2015-11-04 DIAGNOSIS — N959 Unspecified menopausal and perimenopausal disorder: Secondary | ICD-10-CM | POA: Insufficient documentation

## 2015-12-20 ENCOUNTER — Encounter: Payer: Self-pay | Admitting: Emergency Medicine

## 2015-12-20 ENCOUNTER — Ambulatory Visit
Admission: EM | Admit: 2015-12-20 | Discharge: 2015-12-20 | Disposition: A | Discharge status: Home / Self Care | Payer: BLUE CROSS/BLUE SHIELD | Attending: Family Medicine | Admitting: Family Medicine

## 2015-12-20 ENCOUNTER — Ambulatory Visit (INDEPENDENT_AMBULATORY_CARE_PROVIDER_SITE_OTHER): Payer: BLUE CROSS/BLUE SHIELD

## 2015-12-20 DIAGNOSIS — M549 Dorsalgia, unspecified: Secondary | ICD-10-CM

## 2015-12-20 DIAGNOSIS — M546 Pain in thoracic spine: Secondary | ICD-10-CM | POA: Diagnosis not present

## 2015-12-20 DIAGNOSIS — R0781 Pleurodynia: Secondary | ICD-10-CM

## 2015-12-20 MED ORDER — ORPHENADRINE CITRATE ER 100 MG PO TB12: 100.0000 mg | ORAL_TABLET | Freq: Two times a day (BID) | ORAL | Status: DC

## 2015-12-20 MED ORDER — KETOROLAC TROMETHAMINE 60 MG/2ML IM SOLN
60.0000 mg | Freq: Once | INTRAMUSCULAR | Status: AC
  Administered 2015-12-20: 60 mg via INTRAMUSCULAR

## 2015-12-20 MED ORDER — OXYCODONE-ACETAMINOPHEN 5-325 MG PO TABS: 1.0000 | ORAL_TABLET | Freq: Three times a day (TID) | ORAL | Status: DC | PRN

## 2015-12-20 NOTE — Discharge Instructions (Signed)

## 2015-12-20 NOTE — ED Provider Notes (Signed)
CSN: [id number]     Arrival date & time 12/20/15  1738 History   First MD Initiated Contact with Patient 12/20/15 1943    Nurses notes were reviewed. Chief Complaint  Patient presents with  . Back Pain   Patient is here because of back pain. She states that the back pain started Saturday at school better for daughter Sunday from chart and the back pain started back again. Pain has been so bad that she has not been able to go to work on Monday and Tuesday. History of fibromyalgia and an MS. Patient continues to smoke despite having degenerative back disease. She is adopted so no family medical history is available. She does have history of diabetes hypertension thyroid disease fibromyalgia and esophageal cancer along with multiple sclerosis she's had 5 C-sections and subsequent abdominal hysterectomy.   (Consider location/radiation/quality/duration/timing/severity/associated sxs/prior Treatment) Patient is a 45 y.o. female presenting with back pain. The history is provided by the patient.  Back Pain Location:  Thoracic spine Pain severity:  Severe Onset quality:  Sudden Progression:  Worsening Context: physical stress   Context: not jumping from heights, not lifting heavy objects, not occupational injury and not pedestrian accident   Relieved by:  Nothing Worsened by:  Movement and deep breathing Associated symptoms: no dysuria, no headaches and no perianal numbness   Risk factors: no lack of exercise     Past Medical History  Diagnosis Date  . Diabetes mellitus without complication ([place])   . Hypertension   . Thyroid disease   . Fibromyalgia   . Esophageal cancer ([place])   . MS (multiple sclerosis) ([place])    Past Surgical History  Procedure Laterality Date  . Cesarean section    . Abdominal hysterectomy     Family History  Problem Relation Age of Onset  . Adopted: Yes  . Cancer Mother   . Heart failure Father   . Hypertension Father   . Lupus Brother    Social History    Substance Use Topics  . Smoking status: Current Every Day Smoker -- 1.00 packs/day    Types: Cigarettes  . Smokeless tobacco: None  . Alcohol Use: No   OB History    Gravida Para Term Preterm AB TAB SAB Ectopic Multiple Living   11 11   0          Review of Systems  Genitourinary: Negative for dysuria.  Musculoskeletal: Positive for back pain.  Neurological: Negative for headaches.    Allergies  Penicillins; Nefazodone; and Benzoyl peroxide  Home Medications   Prior to Admission medications   Medication Sig Start Date End Date Taking? Authorizing Provider  Ipratropium-Albuterol (COMBIVENT) 20-100 MCG/ACT AERS respimat Inhale 1 puff into the lungs every 6 (six) hours.   Yes Historical Provider, MD  albuterol (PROVENTIL HFA;VENTOLIN HFA) 108 (90 BASE) MCG/ACT inhaler Frequency:PHARMDIR   Dosage:90   MCG  Instructions:  Note:2 puff q4-6 hrs prn shortness of breath or wheezingm use with spacer. Dose: 90MCG 06/18/11   Historical Provider, MD  beclomethasone (QVAR) 80 MCG/ACT inhaler Inhale into the lungs. 01/11/15 01/11/16  Historical Provider, MD  Blood Glucose Monitoring Suppl (FIFTY50 GLUCOSE METER 2.0) W/DEVICE KIT Use as instructed. 05/06/13   Historical Provider, MD  cetirizine (ZYRTEC) 10 MG tablet Take 10 mg by mouth. 12/11/11   Historical Provider, MD  clonazePAM (KLONOPIN) 0.5 MG tablet Take 0.5 mg by mouth. 09/02/13   Historical Provider, MD  clonazePAM (KLONOPIN) 1 MG disintegrating tablet Take by mouth.  Historical Provider, MD  cyclobenzaprine (FLEXERIL) 10 MG tablet Take 1 tablet (10 mg total) by mouth 3 (three) times daily as needed (for esophageal spasm). 03/25/15   Orbie Pyo, MD  diltiazem (CARDIZEM CD) 120 MG 24 hr capsule Take 120 mg by mouth. 12/15/13   Historical Provider, MD  doxycycline (ADOXA) 100 MG tablet Take 100 mg by mouth 2 (two) times daily.    Historical Provider, MD  DULoxetine (CYMBALTA) 20 MG capsule Take by mouth. 08/30/14   Historical  Provider, MD  DULoxetine (CYMBALTA) 60 MG capsule Take 60 mg by mouth. 03/03/13   Historical Provider, MD  esomeprazole (NEXIUM) 40 MG capsule Take 40 mg by mouth. 03/03/13   Historical Provider, MD  Fluticasone-Salmeterol (ADVAIR) 100-50 MCG/DOSE AEPB Frequency:BID   Dosage:0.0     Instructions:  Note:Dose: 1 06/18/11   Historical Provider, MD  gabapentin (NEURONTIN) 300 MG capsule Take 900 mg by mouth 3 (three) times daily. TAKE 2 CAPSULES BY MOUTH THREE TIMES DAILY 11/17/14   Historical Provider, MD  glucose blood test strip USE TWICE DAILY AS DIRECTED 12/14/13   Historical Provider, MD  HYDROcodone-acetaminophen (NORCO/VICODIN) 5-325 MG per tablet Take 1 tablet by mouth every 6 (six) hours as needed for moderate pain.    Historical Provider, MD  hyoscyamine (LEVSIN, ANASPAZ) 0.125 MG tablet Frequency:TID   Dosage:0.125   MG  Instructions:  Note:Dose: 0.125MG 09/02/13   Historical Provider, MD  ipratropium (ATROVENT HFA) 17 MCG/ACT inhaler Frequency:PHARMDIR   Dosage:0.0     Instructions:  Note:2 puffs q 6 hours Dose: 17MCG/SPRAY 06/18/11   Historical Provider, MD  montelukast (SINGULAIR) 10 MG tablet Take 10 mg by mouth. 01/21/12   Historical Provider, MD  mupirocin ointment (BACTROBAN) 2 % Apply 1 application topically 3 (three) times daily. To scalp sores 05/14/15   Sherlene Shams, MD  nitroGLYCERIN (NITRODUR - DOSED IN MG/24 HR) 0.2 mg/hr patch Place 1 patch (0.2 mg total) onto the skin daily. 03/25/15   Orbie Pyo, MD  pregabalin (LYRICA) 150 MG capsule Take 150 mg by mouth. 03/25/13   Historical Provider, MD  promethazine (PHENERGAN) 25 MG tablet Take 25 mg by mouth. 12/11/11   Historical Provider, MD  traZODone (DESYREL) 50 MG tablet Take 50 mg by mouth. 03/03/13   Historical Provider, MD  triamcinolone (KENALOG) 0.1 % paste Use as directed 1 application in the mouth or throat 3 (three) times daily. To sore in left upper jaw until healed 05/14/15   Sherlene Shams, MD   Meds Ordered and  Administered this Visit   Medications  ketorolac (TORADOL) injection 60 mg (60 mg Intramuscular Given 12/20/15 2015)    BP 159/74 mmHg  Pulse 78  Temp(Src) 97 F (36.1 C) (Tympanic)  Resp 16  Ht '5\' 4"'  (1.626 m)  Wt 134 lb (60.782 kg)  BMI 22.99 kg/m2  SpO2 99% No data found.   Physical Exam  Constitutional: She is oriented to person, place, and time. She appears well-developed and well-nourished.  HENT:  Head: Normocephalic and atraumatic.  Eyes: Conjunctivae are normal. Pupils are equal, round, and reactive to light.  Neck: Normal range of motion. Neck supple.  Cardiovascular: Normal rate and normal heart sounds.   Pulmonary/Chest: Breath sounds normal. She is in respiratory distress.  Musculoskeletal: Normal range of motion. She exhibits tenderness.       Thoracic back: She exhibits tenderness, bony tenderness, pain and spasm.       Back:  There is some tenderness over the  lower thoracic spine and some tenderness over the left lower ribs most the pain lower ribs and not only is it more intense but is over greater area as well.  Neurological: She is alert and oriented to person, place, and time.  Skin: Skin is warm and dry.  Psychiatric: She has a normal mood and affect. Her behavior is normal.    ED Course  Procedures (including critical care time)  Labs Review Labs Reviewed - No data to display  Imaging Review Dg Ribs Bilateral W/chest  12/20/2015  CLINICAL DATA:  Acute onset of right posterior chest pain and cough. Initial encounter. EXAM: BILATERAL RIBS AND CHEST - 4+ VIEW COMPARISON:  Chest radiograph performed 03/25/2015 FINDINGS: No displaced rib fractures are seen. The lungs are well-aerated and clear. There is no evidence of focal opacification, pleural effusion or pneumothorax. The cardiomediastinal silhouette is within normal limits. No acute osseous abnormalities are seen. There is mild chronic deformity of the right posterior fifth rib. IMPRESSION: No  displaced rib fracture seen. No acute cardiopulmonary process identified. Electronically Signed   By: Garald Balding M.D.   On: 12/20/2015 20:18     Visual Acuity Review  Right Eye Distance:   Left Eye Distance:   Bilateral Distance:    Right Eye Near:   Left Eye Near:    Bilateral Near:        MDM   1. Back pain    Because of the mild pain the patient is having and the lack of efficacy she states that Toradol provided for her back we will give her very limited number of Percocet. Looking her up at the [facility] colonic control substance system she was taking Percocet to November frequently. Appears that she is not taking on a regular basis since then. Will give her a dozen distress importance of following up with her PCP. Also will stop her Flexeril will place on Norflex 101 tablet twice a day for muscle spasm and pain relief. Lastly she doesn't really report that she has had old rib fractures which is probably the deformity they saw on the fifth rib on the right .    Frederich Cha, MD 12/20/15 2109

## 2015-12-20 NOTE — ED Notes (Signed)
Patient c/o chronic back pain for the past 4 years.  Patient states that she has DDD.  Patient c/o mid back pain that had gotten worse on Saturday.

## 2015-12-23 ENCOUNTER — Encounter: Payer: Self-pay | Admitting: *Deleted

## 2015-12-23 ENCOUNTER — Ambulatory Visit
Admission: EM | Admit: 2015-12-23 | Discharge: 2015-12-23 | Disposition: A | Discharge status: Home / Self Care | Payer: BLUE CROSS/BLUE SHIELD | Attending: Family Medicine | Admitting: Family Medicine

## 2015-12-23 DIAGNOSIS — S29009A Unspecified injury of muscle and tendon of unspecified wall of thorax, initial encounter: Secondary | ICD-10-CM

## 2015-12-23 DIAGNOSIS — S29019A Strain of muscle and tendon of unspecified wall of thorax, initial encounter: Secondary | ICD-10-CM

## 2015-12-23 MED ORDER — OXYCODONE-ACETAMINOPHEN 5-325 MG PO TABS: 1.0000 | ORAL_TABLET | Freq: Three times a day (TID) | ORAL | Status: DC | PRN

## 2015-12-23 NOTE — Discharge Instructions (Signed)
Take medication as prescribed. Rest. Drink plenty of fluids.   Follow up with your primary care physician this week.   Return to Urgent care or go to ER for increased pain, numbness, tingling sensation, urinary changes, new or worsening concerns.    Thoracic Strain A thoracic strain, which is sometimes called a mid-back strain, is an injury to the muscles or tendons that attach to the upper part of your back behind your chest. This type of injury occurs when a muscle is overstretched or overloaded.  Thoracic strains can range from mild to severe. Mild strains may involve stretching a muscle or tendon without tearing it. These injuries may heal in 1-2 weeks. More severe strains involve tearing of muscle fibers or tendons. These will cause more pain and may take 6-8 weeks to heal. CAUSES This condition may be caused by:  An injury in which a sudden force is placed on the muscle.  Exercising without properly warming up.  Overuse of the muscle.  Improper form during certain movements.  Other injuries that surround or cause stress on the mid-back, causing a strain on the muscles. In some cases, the cause may not be known. RISK FACTORS This injury is more common in:  Athletes.  People with obesity. SYMPTOMS The main symptom of this condition is pain, especially with movement. Other symptoms include:  Bruising.  Swelling.  Spasm. DIAGNOSIS This condition may be diagnosed with a physical exam. X-rays may be taken to check for a fracture. TREATMENT This condition may be treated with:  Resting and icing the injured area.  Physical therapy. This will involve doing stretching and strengthening exercises.  Medicines for pain and inflammation. HOME CARE INSTRUCTIONS  Rest as needed. Follow instructions from your health care provider about any restrictions on activity.  If directed, apply ice to the injured area:  Put ice in a plastic bag.  Place a towel between your skin and  the bag.  Leave the ice on for 20 minutes, 2-3 times per day.  Take over-the-counter and prescription medicines only as told by your health care provider.  Begin doing exercises as told by your health care provider or physical therapist.  Always warm up properly before physical activity or sports.  Bend your knees before you lift heavy objects.  Keep all follow-up visits as told by your health care provider. This is important. SEEK MEDICAL CARE IF:  Your pain is not helped by medicine.  Your pain, bruising, or swelling is getting worse.  You have a fever. SEEK IMMEDIATE MEDICAL CARE IF:  You have shortness of breath.  You have chest pain.  You develop numbness or weakness in your legs.  You have involuntary loss of urine (urinary incontinence).   This information is not intended to replace advice given to you by your health care provider. Make sure you discuss any questions you have with your health care provider.   Document Released: 12/15/2003 Document Revised: 06/15/2015 Document Reviewed: 11/18/2014 Elsevier Interactive Patient Education [employer].

## 2015-12-23 NOTE — ED Notes (Signed)
Seen here Tues, here tonight c/o back pain continuing but improved. Denies injury.

## 2015-12-23 NOTE — ED Provider Notes (Signed)
[facility]  ____________________________________________  Time seen: Approximately 9:11 PM  I have reviewed the triage vital signs and the nursing notes.   HISTORY  Chief Complaint Back Pain   HPI Lacey May is a 45 y.o. female presents for the complaint of thoracic back pain 1.5 weeks. Patient reports that last Tuesday she started a new area at work. Patient states that she works at a [employer] that [employer]. 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 Patient states that the boxes are at waist height and has to use her hands to flip him over, and states that these boxes often way "a lot". Denies any bending and lifting. Patient also reports that she is having to frequently stand and twist.  Patient reports that she has a long history of back pain. Patient reports her back pain at baseline is due to have 11 children, degenerative disc disease, multiple sclerosis as well as fibromyalgia. Patient reports that this pain is different than her normal back pain. Patient reports that after starting day position at work last Tuesday she was having pain in the same location starting on Friday.  Patient reports that she was seen in urgent care this past Tuesday for the same. Patient states that time she was treated with Percocet as well as Norflex. Patient reports that with his medicines her pain did improve but states has not resolved. Patient reports that she has continued to work and remain active.  Denies fall, direct injury or direct trauma. Reports has remained active and continue with daily tasks. Denies chest pain or shortness breath, dizziness, weakness, numbness, pain radiation, urinary or bowel retention or incontinence. Denies dysuria or vaginal complaints. Patient reports that she is postmenopausal.  PCP: Vickki Muff  Past Medical History  Diagnosis Date  . Diabetes mellitus without  complication ([place])   . Hypertension   . Thyroid disease   . Fibromyalgia   . Esophageal cancer ([place])   . MS (multiple sclerosis) ([place])     Patient Active Problem List   Diagnosis Date Noted  . Palpitations 02/02/2015    Past Surgical History  Procedure Laterality Date  . Cesarean section    . Abdominal hysterectomy      Current Outpatient Rx  Name  Route  Sig  Dispense  Refill  . albuterol (PROVENTIL HFA;VENTOLIN HFA) 108 (90 BASE) MCG/ACT inhaler      Frequency:PHARMDIR   Dosage:90   MCG  Instructions:  Note:2 puff q4-6 hrs prn shortness of breath or wheezingm use with spacer. Dose: 90MCG         . beclomethasone (QVAR) 80 MCG/ACT inhaler   Inhalation   Inhale into the lungs.         . Blood Glucose Monitoring Suppl (FIFTY50 GLUCOSE METER 2.0) W/DEVICE KIT      Use as instructed.         . cetirizine (ZYRTEC) 10 MG tablet   Oral   Take 10 mg by mouth.         . diltiazem (CARDIZEM CD) 120 MG 24 hr capsule   Oral   Take 120 mg by mouth.         . DULoxetine (CYMBALTA) 20 MG capsule   Oral   Take by mouth.         . esomeprazole (NEXIUM) 40 MG capsule   Oral   Take 40 mg by mouth.         Marland Kitchen  gabapentin (NEURONTIN) 300 MG capsule   Oral   Take 900 mg by mouth 3 (three) times daily. TAKE 2 CAPSULES BY MOUTH THREE TIMES DAILY         . glucose blood test strip      USE TWICE DAILY AS DIRECTED         . hyoscyamine (LEVSIN, ANASPAZ) 0.125 MG tablet      Frequency:TID   Dosage:0.125   MG  Instructions:  Note:Dose: 0.125MG         . ipratropium (ATROVENT HFA) 17 MCG/ACT inhaler      Frequency:PHARMDIR   Dosage:0.0     Instructions:  Note:2 puffs q 6 hours Dose: 17MCG/SPRAY         . Ipratropium-Albuterol (COMBIVENT) 20-100 MCG/ACT AERS respimat   Inhalation   Inhale 1 puff into the lungs every 6 (six) hours.         . nitroGLYCERIN (NITRODUR - DOSED IN MG/24 HR) 0.2 mg/hr patch   Transdermal   Place 1 patch (0.2 mg total) onto the  skin daily.   15 patch   0   . orphenadrine (NORFLEX) 100 MG tablet   Oral   Take 1 tablet (100 mg total) by mouth 2 (two) times daily. Did not take with Flexeril   60 tablet   0   .                                 .           .           .           . Fluticasone-Salmeterol (ADVAIR) 100-50 MCG/DOSE AEPB      Frequency:BID   Dosage:0.0     Instructions:  Note:Dose: 1         . HYDROcodone-acetaminophen (NORCO/VICODIN) 5-325 MG per tablet   Oral   Take 1 tablet by mouth every 6 (six) hours as needed for moderate pain.         . montelukast (SINGULAIR) 10 MG tablet   Oral   Take 10 mg by mouth.         .           .           . pregabalin (LYRICA) 150 MG capsule   Oral   Take 150 mg by mouth.         . traZODone (DESYREL) 50 MG tablet   Oral   Take 50 mg by mouth.         . triamcinolone (KENALOG) 0.1 % paste   Mouth/Throat   Use as directed 1 application in the mouth or throat 3 (three) times daily. To sore in left upper jaw until healed   5 g   0     Allergies Penicillins; Nefazodone; and Benzoyl peroxide  Family History  Problem Relation Age of Onset  . Adopted: Yes  . Cancer Mother   . Heart failure Father   . Hypertension Father   . Lupus Brother     Social History Social History  Substance Use Topics  . Smoking status: Current Every Day Smoker -- 1.00 packs/day    Types: Cigarettes  . Smokeless tobacco: None  . Alcohol Use: No    Review of Systems Constitutional: No fever/chills Eyes: No visual changes. ENT: No sore throat. Cardiovascular: Denies chest pain. Respiratory: Denies shortness  of breath. Gastrointestinal: No abdominal pain.  No nausea, no vomiting.  No diarrhea.  No constipation. Genitourinary: Negative for dysuria. Musculoskeletal: Positive for back pain. Skin: Negative for rash. Neurological: Negative for headaches, focal weakness or numbness.  10-point ROS otherwise  negative.  ____________________________________________   PHYSICAL EXAM:  VITAL SIGNS: ED Triage Vitals  Enc Vitals Group     BP 12/23/15 1833 145/82 mmHg     Pulse Rate 12/23/15 1833 70     Resp 12/23/15 1833 16     Temp 12/23/15 1833 98.2 F (36.8 C)     Temp Source 12/23/15 1833 Oral     SpO2 12/23/15 1833 100 %     Weight 12/23/15 1833 139 lb (63.05 kg)     Height 12/23/15 1833 _0  (1.626 m)     Head Cir --      Peak Flow --      Pain Score 12/23/15 2102 8     Pain Loc --      Pain Edu? --      Excl. in [place]? --     Constitutional: Alert and oriented. Well appearing and in no acute distress. Eyes: Conjunctivae are normal. PERRL. EOMI. Head: Atraumatic.  Ears: no erythema, normal TMs bilaterally  Nose: No congestion/rhinnorhea.  Mouth/Throat: Mucous membranes are moist.  Oropharynx non-erythematous. Neck: No stridor.  No cervical spine tenderness to palpation. Hematological/Lymphatic/Immunilogical: No cervical lymphadenopathy. Cardiovascular: Normal rate, regular rhythm. Grossly normal heart sounds.  Good peripheral circulation. Respiratory: Normal respiratory effort.  No retractions. Lungs CTAB. Gastrointestinal: Soft and nontender. No distention. Normal Bowel sounds.  No abdominal bruits. No CVA tenderness. Musculoskeletal: No lower or upper extremity tenderness nor edema.  No joint effusions. Bilateral pedal pulses equal and easily palpated.   Full range of motion to bilateral upper and lower extremities as well as cervical, thoracic and lumbar areas. Mild to mod point muscular tenderness to bilateral parathoracic as well as upper bilateral latissimus dorsi and lower bilateral trapezius. Again no midline vertebral tenderness. Bilateral straight leg test negative. Bilateral plantar flexion and dorsiflexion strong and equal. No saddle anesthesia. 5 out of 5 strength to bilateral upper and lower extremities. Neurologic:  Normal speech and language. No gross focal neurologic  deficits are appreciated. No gait instability. Skin:  Skin is warm, dry and intact. No rash noted. Psychiatric: Mood and affect are normal. Speech and behavior are normal.  ____________________________________________   LABS (all labs ordered are listed, but only abnormal results are displayed)  Labs Reviewed - No data to display   INITIAL IMPRESSION / ASSESSMENT AND PLAN / ED COURSE  Pertinent labs & imaging results that were available during my care of the patient were reviewed by me and considered in my medical decision making (see chart for details).  Very well-appearing patient. No acute distress. Presents for the complaints of 1.5 weeks of parathoracic back pain. Patient with no midline cervical, thoracic or lumbar tenderness to palpation. No focal neurological deficits. Full range of motion to bilateral upper and lower extremities as well as cervical, thoracic and lumbar areas. Patient able to fully demonstrate her range of motion exercises that she is required to do at work in the room without any obvious distress. Patient with point muscular tenderness to bilateral parathoracic as well as bilateral latissimus dorsi and trapezius. Again no midline vertebral tenderness. [place] controlled substance database utilizing reviewed with controlled substance prescription earlier this week as well as in november 2016.   Suspect muscular strain. Suspect muscular  strain is directly related to recent changing of activities at work. Minimal pain at rest, pain is fully reproducible by direct palpation and movement per patient. Rib series x-rays were completed at urgent care visit earlier this week without acute changes. Patient states that Toradol does not work well for her and does not want Toradol. Discussed in detail with patient regarding stretching, alteration heat and ice as well as regular exercise regimens/stretching. Counseled regarding proper body mechanics. Discussed will when necessary Percocet  quantity #9 given. Encouraged close PCP follow-up. Discussed indication, risks and benefits of medications with patient.   As completing visit, patient patient request work note from yesterday through Monday. Discussed in detail with patient as patient is fully able to do all movements with good range of motion, suspect muscular strain. Discussed with patient that we will provide a note stating she was seen in urgent care today but cannot take patient out of work for this complaint of time.. Counseled patient to follow-up closely with her primary care physician as needed for continued pain. Discussed patient and planning care with Dr. Zenda Alpers who agrees with plan.  Discussed follow up with Primary care physician this week. Discussed follow up and return parameters including no resolution or any worsening concerns. Patient verbalized understanding and agreed to plan.   ____________________________________________   FINAL CLINICAL IMPRESSION(S) / ED DIAGNOSES  Final diagnoses:  Thoracic myofascial strain, initial encounter      Note: This dictation was prepared with Dragon dictation along with smaller phrase technology. Any transcriptional errors that result from this process are unintentional.    Marylene Land, NP 12/23/15 2128

## 2016-01-02 DIAGNOSIS — S46911A Strain of unspecified muscle, fascia and tendon at shoulder and upper arm level, right arm, initial encounter: Secondary | ICD-10-CM | POA: Insufficient documentation

## 2016-01-02 DIAGNOSIS — M24211 Disorder of ligament, right shoulder: Secondary | ICD-10-CM | POA: Insufficient documentation

## 2016-03-20 ENCOUNTER — Emergency Department: Payer: BLUE CROSS/BLUE SHIELD

## 2016-03-20 ENCOUNTER — Emergency Department
Admission: EM | Admit: 2016-03-20 | Discharge: 2016-03-20 | Discharge status: Against Medical Advice | Payer: BLUE CROSS/BLUE SHIELD | Attending: Student | Admitting: Student

## 2016-03-20 ENCOUNTER — Encounter: Payer: Self-pay | Admitting: Emergency Medicine

## 2016-03-20 DIAGNOSIS — R531 Weakness: Secondary | ICD-10-CM

## 2016-03-20 DIAGNOSIS — R519 Headache, unspecified: Secondary | ICD-10-CM

## 2016-03-20 DIAGNOSIS — Z8501 Personal history of malignant neoplasm of esophagus: Secondary | ICD-10-CM | POA: Diagnosis not present

## 2016-03-20 DIAGNOSIS — Z792 Long term (current) use of antibiotics: Secondary | ICD-10-CM | POA: Insufficient documentation

## 2016-03-20 DIAGNOSIS — R51 Headache: Secondary | ICD-10-CM | POA: Diagnosis not present

## 2016-03-20 DIAGNOSIS — Z79899 Other long term (current) drug therapy: Secondary | ICD-10-CM | POA: Diagnosis not present

## 2016-03-20 DIAGNOSIS — E119 Type 2 diabetes mellitus without complications: Secondary | ICD-10-CM | POA: Insufficient documentation

## 2016-03-20 DIAGNOSIS — Z7951 Long term (current) use of inhaled steroids: Secondary | ICD-10-CM | POA: Diagnosis not present

## 2016-03-20 DIAGNOSIS — Z79891 Long term (current) use of opiate analgesic: Secondary | ICD-10-CM | POA: Diagnosis not present

## 2016-03-20 DIAGNOSIS — F17210 Nicotine dependence, cigarettes, uncomplicated: Secondary | ICD-10-CM | POA: Insufficient documentation

## 2016-03-20 DIAGNOSIS — I10 Essential (primary) hypertension: Secondary | ICD-10-CM | POA: Insufficient documentation

## 2016-03-20 LAB — TROPONIN I: Troponin I: <0.03 ng/mL (ref <0.031)

## 2016-03-20 LAB — CBC
HCT: 37.9 % (ref 35.0–47.0)
HEMOGLOBIN: 12.1 g/dL (ref 12.0–16.0)
MCH: 26.9 pg (ref 26.0–34.0)
MCHC: 31.9 g/dL — ABNORMAL LOW (ref 32.0–36.0)
MCV: 84.5 fL (ref 80.0–100.0)
Platelets: 229 10*3/uL (ref 150–440)
RBC: 4.49 MIL/uL (ref 3.80–5.20)
RDW: 17.3 % — ABNORMAL HIGH (ref 11.5–14.5)
WBC: 10.1 10*3/uL (ref 3.6–11.0)

## 2016-03-20 LAB — COMPREHENSIVE METABOLIC PANEL
ALBUMIN: 4.4 g/dL (ref 3.5–5.0)
ALK PHOS: 85 U/L (ref 38–126)
ALT: 144 U/L — ABNORMAL HIGH (ref 14–54)
AST: 130 U/L — AB (ref 15–41)
Anion gap: 9 (ref 5–15)
BILIRUBIN TOTAL: 0.3 mg/dL (ref 0.3–1.2)
BUN: 8 mg/dL (ref 6–20)
CO2: 28 mmol/L (ref 22–32)
Calcium: 10.3 mg/dL (ref 8.9–10.3)
Chloride: 102 mmol/L (ref 101–111)
Creatinine, Ser: 1.05 mg/dL — ABNORMAL HIGH (ref 0.44–1.00)
GFR calc Af Amer: >60 mL/min (ref >60)
GFR calc non Af Amer: >60 mL/min (ref >60)
GLUCOSE: 118 mg/dL — AB (ref 65–99)
POTASSIUM: 4 mmol/L (ref 3.5–5.1)
Sodium: 139 mmol/L (ref 135–145)
TOTAL PROTEIN: 8.2 g/dL — AB (ref 6.5–8.1)

## 2016-03-20 LAB — APTT: APTT: 32 s (ref 24–36)

## 2016-03-20 LAB — DIFFERENTIAL
BASOS ABS: 0.1 10*3/uL (ref 0–0.1)
Basophils Relative: 1 %
Eosinophils Absolute: 0.1 10*3/uL (ref 0–0.7)
Eosinophils Relative: 1 %
LYMPHS ABS: 2.4 10*3/uL (ref 1.0–3.6)
LYMPHS PCT: 24 %
Monocytes Absolute: 0.5 10*3/uL (ref 0.2–0.9)
Monocytes Relative: 5 %
NEUTROS PCT: 69 %
Neutro Abs: 7.1 10*3/uL — ABNORMAL HIGH (ref 1.4–6.5)

## 2016-03-20 LAB — PROTIME-INR
INR: 0.96
PROTHROMBIN TIME: 13 s (ref 11.4–15.0)

## 2016-03-20 LAB — GLUCOSE, CAPILLARY: Glucose-Capillary: 91 mg/dL (ref 65–99)

## 2016-03-20 MED ORDER — MORPHINE SULFATE (PF) 4 MG/ML IV SOLN
4.0000 mg | Freq: Once | INTRAVENOUS | Status: AC
  Administered 2016-03-20: 4 mg via INTRAVENOUS
  Filled 2016-03-20: qty 1

## 2016-03-20 MED ORDER — SODIUM CHLORIDE 0.9 % IV BOLUS (SEPSIS)
500.0000 mL | Freq: Once | INTRAVENOUS | Status: AC
  Administered 2016-03-20: 500 mL via INTRAVENOUS

## 2016-03-20 MED ORDER — ONDANSETRON HCL 4 MG/2ML IJ SOLN
4.0000 mg | Freq: Once | INTRAMUSCULAR | Status: AC
  Administered 2016-03-20: 4 mg via INTRAVENOUS
  Filled 2016-03-20: qty 2

## 2016-03-20 MED ORDER — GADOBENATE DIMEGLUMINE 529 MG/ML IV SOLN
15.0000 mL | Freq: Once | INTRAVENOUS | Status: AC | PRN
  Administered 2016-03-20: 12 mL via INTRAVENOUS

## 2016-03-20 NOTE — ED Notes (Signed)
Patient transported to MRI 

## 2016-03-20 NOTE — ED Notes (Addendum)
Pt in via triage with complaints of headache, nausea since approximately 0300 today.  Pt reports going to bathroom, pt reports fall when trying to get up from toile; pt denies hitting head, denies LOC, states she landed on her left hip.  Pt reports being "off balance" and weakness, decreased sensation to left arm.  Pt reports hx of migraines, reports family hx of CVA.  Pt A/Ox4, vitals WDL.

## 2016-03-20 NOTE — ED Notes (Signed)
Pt to CT

## 2016-03-20 NOTE — Discharge Instructions (Signed)
You were seen in the emergency department for headache and weakness which could be related to your multiple sclerosis. You have decided to leave [facility]. If you change your mind at any time and desire the additional testing and treatments discussed or if your symptoms worsen at any point or you have any new complaints including new numbness, weakness, severe or worsening headache, vision changes, chest pain, difficulty breathing or any other concerns, please return immediately and we are happy to treat you. Follow up with your neurologist as soon as possible.

## 2016-03-20 NOTE — ED Notes (Signed)
Pt reports severe headache since 0300, reports left arm weakness and unable to close left hand. Pt reports vomiting and reports loss of balance. Pt ambulatory to triage with steady gait, some weakness noted to left side.

## 2016-03-20 NOTE — ED Provider Notes (Signed)
[facility] Emergency Department Provider Note   ____________________________________________  Time seen: Approximately 11:39 AM  I have reviewed the triage vital signs and the nursing notes.   HISTORY  Chief Complaint Headache and Weakness    HPI Lacey May is a 45 y.o. female with history of diabetes, migraines, hypertension, thyroid disease, fibromyalgia and esophageal cancer along with multiple sclerosis who presents for evaluation of headache and weakness since 3 AM this morning, constant, severe, no modifying factors. Patient reports that she awoke from sleep at 3 AM with severe pressure-like headache which feels different from her prior migraines. She reports that she also felt nauseated and was dry heaving but when she attempted to grab the toilet, she noted that her left arm and left leg were weak. This has been ongoing throughout the day. No chest pain difficulty breathing. No fevers or chills. She has had a sensation of being "off balance" for some time but she denies history of CVA.    Past Medical History  Diagnosis Date  . Diabetes mellitus without complication ([place])   . Hypertension   . Thyroid disease   . Fibromyalgia   . Esophageal cancer ([place])   . MS (multiple sclerosis) ([place])     Patient Active Problem List   Diagnosis Date Noted  . Palpitations 02/02/2015    Past Surgical History  Procedure Laterality Date  . Cesarean section    . Abdominal hysterectomy      Current Outpatient Rx  Name  Route  Sig  Dispense  Refill  . albuterol (PROVENTIL HFA;VENTOLIN HFA) 108 (90 BASE) MCG/ACT inhaler      Frequency:PHARMDIR   Dosage:90   MCG  Instructions:  Note:2 puff q4-6 hrs prn shortness of breath or wheezingm use with spacer. Dose: 90MCG         . EXPIRED: beclomethasone (QVAR) 80 MCG/ACT inhaler   Inhalation   Inhale into the lungs.         . cetirizine (ZYRTEC) 10 MG tablet   Oral   Take 10 mg by mouth.         .  clonazePAM (KLONOPIN) 0.5 MG tablet   Oral   Take 0.5 mg by mouth.         . clonazePAM (KLONOPIN) 1 MG disintegrating tablet   Oral   Take by mouth.         . cyclobenzaprine (FLEXERIL) 10 MG tablet   Oral   Take 1 tablet (10 mg total) by mouth 3 (three) times daily as needed (for esophageal spasm).   15 tablet   0   . diltiazem (CARDIZEM CD) 120 MG 24 hr capsule   Oral   Take 120 mg by mouth.         . doxycycline (ADOXA) 100 MG tablet   Oral   Take 100 mg by mouth 2 (two) times daily.         . DULoxetine (CYMBALTA) 20 MG capsule   Oral   Take by mouth.         . DULoxetine (CYMBALTA) 60 MG capsule   Oral   Take 60 mg by mouth.         . esomeprazole (NEXIUM) 40 MG capsule   Oral   Take 40 mg by mouth.         . Fluticasone-Salmeterol (ADVAIR) 100-50 MCG/DOSE AEPB      Frequency:BID   Dosage:0.0     Instructions:  Note:Dose: 1         .  gabapentin (NEURONTIN) 300 MG capsule   Oral   Take 900 mg by mouth 3 (three) times daily. TAKE 2 CAPSULES BY MOUTH THREE TIMES DAILY         . HYDROcodone-acetaminophen (NORCO/VICODIN) 5-325 MG per tablet   Oral   Take 1 tablet by mouth every 6 (six) hours as needed for moderate pain.         . hyoscyamine (LEVSIN, ANASPAZ) 0.125 MG tablet      Frequency:TID   Dosage:0.125   MG  Instructions:  Note:Dose: 0.125MG          . ipratropium (ATROVENT HFA) 17 MCG/ACT inhaler      Frequency:PHARMDIR   Dosage:0.0     Instructions:  Note:2 puffs q 6 hours Dose: 17MCG/SPRAY         . Ipratropium-Albuterol (COMBIVENT) 20-100 MCG/ACT AERS respimat   Inhalation   Inhale 1 puff into the lungs every 6 (six) hours.         . montelukast (SINGULAIR) 10 MG tablet   Oral   Take 10 mg by mouth.         . mupirocin ointment (BACTROBAN) 2 %   Topical   Apply 1 application topically 3 (three) times daily. To scalp sores   30 g   0   . nitroGLYCERIN (NITRODUR - DOSED IN MG/24 HR) 0.2 mg/hr patch    Transdermal   Place 1 patch (0.2 mg total) onto the skin daily.   15 patch   0   . orphenadrine (NORFLEX) 100 MG tablet   Oral   Take 1 tablet (100 mg total) by mouth 2 (two) times daily. Did not take with Flexeril   60 tablet   0   . oxyCODONE-acetaminophen (ROXICET) 5-325 MG tablet   Oral   Take 1 tablet by mouth every 8 (eight) hours as needed for moderate pain or severe pain (Do not drive or operate heavy machinery while taking as can cause drowsiness.).   9 tablet   0   . pregabalin (LYRICA) 150 MG capsule   Oral   Take 150 mg by mouth.         . promethazine (PHENERGAN) 25 MG tablet   Oral   Take 25 mg by mouth.         . traZODone (DESYREL) 50 MG tablet   Oral   Take 50 mg by mouth.         . triamcinolone (KENALOG) 0.1 % paste   Mouth/Throat   Use as directed 1 application in the mouth or throat 3 (three) times daily. To sore in left upper jaw until healed   5 g   0     Allergies Penicillins; Nefazodone; and Benzoyl peroxide  Family History  Problem Relation Age of Onset  . Adopted: Yes  . Cancer Mother   . Heart failure Father   . Hypertension Father   . Lupus Brother     Social History Social History  Substance Use Topics  . Smoking status: Current Every Day Smoker -- 1.00 packs/day    Types: Cigarettes  . Smokeless tobacco: None  . Alcohol Use: No    Review of Systems Constitutional: No fever/chills Eyes: No visual changes. ENT: No sore throat. Cardiovascular: Denies chest pain. Respiratory: Denies shortness of breath. Gastrointestinal: No abdominal pain.  No nausea, no vomiting.  No diarrhea.  No constipation. Genitourinary: Negative for dysuria. Musculoskeletal: Negative for back pain. Skin: Negative for rash. Neurological: Positive for headaches, focal weakness  And numbness.  10-point ROS otherwise negative.  ____________________________________________   PHYSICAL EXAM:  VITAL SIGNS: ED Triage Vitals  Enc Vitals Group      BP 03/20/16 1133 145/78 mmHg     Pulse Rate 03/20/16 1133 91     Resp 03/20/16 1133 16     Temp 03/20/16 1133 97.5 F (36.4 C)     Temp Source 03/20/16 1133 Oral     SpO2 03/20/16 1133 99 %     Weight 03/20/16 1133 131 lb (59.421 kg)     Height 03/20/16 1133 5\' 4"  (1.626 m)     Head Cir --      Peak Flow --      Pain Score 03/20/16 1133 9     Pain Loc --      Pain Edu? --      Excl. in [place]? --     Constitutional: Alert and oriented. Well appearing and in no acute distress. Eyes: Conjunctivae are normal. PERRL. EOMI. Head: Atraumatic. Nose: No congestion/rhinnorhea. Mouth/Throat: Mucous membranes are moist.  Oropharynx non-erythematous. Neck: No stridor.  Supple without meningismus. Cardiovascular: Normal rate, regular rhythm. Grossly normal heart sounds.  Good peripheral circulation. Respiratory: Normal respiratory effort.  No retractions. Lungs CTAB. Gastrointestinal: Soft and nontender. No distention.  No CVA tenderness. Genitourinary: deferred Musculoskeletal: No lower extremity tenderness nor edema.  No joint effusions. Neurologic:  Normal speech and language.  3+ out of 5 strength in the left upper extremity, decreased sensation to light touch in the left arm. 4-5 strength in the left lower extremity, decreased sensation to light touch in the left leg. 5 out of 5 strength in the right upper and lower extremity and sensation is intact to light touch in those extremities. She has a faint right facial droop however her smile symmetric. Skin:  Skin is warm, dry and intact. No rash noted. Psychiatric: Mood and affect are normal. Speech and behavior are normal.  ____________________________________________   LABS (all labs ordered are listed, but only abnormal results are displayed)  Labs Reviewed  CBC - Abnormal; Notable for the following:    MCHC 31.9 (*)    RDW 17.3 (*)    All other components within normal limits  DIFFERENTIAL - Abnormal; Notable for the following:     Neutro Abs 7.1 (*)    All other components within normal limits  COMPREHENSIVE METABOLIC PANEL - Abnormal; Notable for the following:    Glucose, Bld 118 (*)    Creatinine, Ser 1.05 (*)    Total Protein 8.2 (*)    AST 130 (*)    ALT 144 (*)    All other components within normal limits  PROTIME-INR  APTT  TROPONIN I  GLUCOSE, CAPILLARY  CBG MONITORING, ED   ____________________________________________  EKG  ED ECG REPORT I, Joanne Gavel, the attending physician, personally viewed and interpreted this ECG.   Date: 03/20/2016  EKG Time: 11:49  Rate: 86  Rhythm: normal sinus rhythm  Axis: normal  Intervals:none  ST&T Change: No acute ST elevation. If I wonder and B6.  ____________________________________________  RADIOLOGY  CT head IMPRESSION: 1. No acute finding. 2. Remote small vessel infarct in the right cerebellum.  MRI brain, MRA  EXAM: MRI HEAD WITHOUT AND WITH CONTRAST  MRA HEAD WITHOUT CONTRAST  TECHNIQUE: Multiplanar, multiecho pulse sequences of the brain and surrounding structures were obtained without and with intravenous contrast. Angiographic images of the head were obtained using MRA technique without contrast.  CONTRAST: 30mL MULTIHANCE  GADOBENATE DIMEGLUMINE 529 MG/ML IV SOLN  COMPARISON: Head CT 03/20/2016  FINDINGS: MRI HEAD FINDINGS  The study is mildly motion degraded.  There is no evidence of acute infarct, intracranial hemorrhage, mass, midline shift, or extra-axial fluid collection. Ventricles and sulci are normal. There are small infarcts in the superior cerebellum bilaterally. A few small foci of T2 hyperintensity in the cerebral white matter are nonspecific. No abnormal enhancement is identified.  Orbits are unremarkable. Paranasal sinuses and mastoid air cells are clear. Major intracranial vascular flow voids are preserved.  MRA HEAD FINDINGS  The study is mildly motion degraded.  The visualized distal vertebral  arteries are widely patent with the left being minimally larger than the right. PICA, AICA, and SCA origins are patent. Basilar artery is widely patent. There are patent posterior communicating arteries bilaterally, and the right P1 segment is severely hypoplastic. No significant PCA stenosis is identified.  Internal carotid arteries are patent from skullbase to carotid termini without evidence of flow limiting stenosis. There is the suggestion of mild right and moderate left proximal supraclinoid stenosis, however this may be artifactual due to motion and vessel orientation in this region. There is a patent anterior communicating artery. ACAs and MCAs are patent without evidence of major branch occlusion or significant proximal stenosis within limitations of motion artifact. No intracranial aneurysm is identified.  IMPRESSION: 1. No acute intracranial abnormality. 2. Small chronic bilateral cerebellar infarcts. 3. Mildly motion degraded MRA without evidence of major branch occlusion or flow limiting proximal stenosis. Mild-to-moderate stenosis versus artifact involving supraclinoid ICAs.  ____________________________________________   PROCEDURES  Procedure(s) performed: None  Critical Care performed: No  ____________________________________________   INITIAL IMPRESSION / ASSESSMENT AND PLAN / ED COURSE  Pertinent labs & imaging results that were available during my care of the patient were reviewed by me and considered in my medical decision making (see chart for details).  ICES KAMERER is a 45 y.o. female with history of diabetes, migraines, hypertension, thyroid disease, fibromyalgia and esophageal cancer along with multiple sclerosis who presents for evaluation of headache and weakness since 3 AM this morning. On exam, she is generally well-appearing and in no acute distress. Her vital signs are stable and she is afebrile. She does have objective weakness in the left arm  and the left leg with decreased sensation to light touch. She is outside window for TPA and is therefore not a candidate. CT scan of her head shows no acute finding, remote right cerebellar infarct is noted. I discussed case with Dr. Doy Mince of neurology given atypical headache as well as history of multiple sclerosis, we'll proceed with MRI/MRA of the brain which will help to evaluate for any evidence of bleeding, MS exacerbation, ischemic CVA. We'll treat her migraine and anticipate admission.  ----------------------------------------- 2:59 PM on 03/20/2016 ----------------------------------------- I reviewed the patient's labs. CBC generally unremarkable, CMP with mild creatinine elevation of 1.05. AST and ALT are mildly elevated but normal T bili, no abdominal tenderness, no abdominal pain. Troponin negative. MRI and MRA of the brain with no acute abnormalities, no bleed, no mass, no acute infarct. Small chronic cerebellar infarcts noted, there is artifact versus mild to moderate stenosis of the ICA bilaterally. She claims a mild to moderate headache at this time which has improved with treatment and appears comfortable but given continued mild weakness, I recommended admission for further headache treatment, reassessments to evaluate for resolution of her weakness. If her weakness did not resolve with complete resolution of her headache, she  would require neurology consult and would possibly need steroids but she has adamantly refused this and is leaving [place], she wants to go home at this time. I have again recommended admission, I discussed with her that her symptoms could result in permanent neurological disability, loss of current lifestyle ,death and she voices understanding of this and is able to repeat this back to me. She still desires discharge. She says that she will call her neurologist at [facility] tomorrow and that she is typically able to be seen quickly. I offered to transfer  her to [facility] however she has also refused that. She has signed AMA paperwork and she has a capacity to make this decision for herself.  ____________________________________________   FINAL CLINICAL IMPRESSION(S) / ED DIAGNOSES  Final diagnoses:  Acute nonintractable headache, unspecified headache type  Weakness      NEW MEDICATIONS STARTED DURING THIS VISIT:  New Prescriptions   No medications on file     Note:  This document was prepared using Dragon voice recognition software and may include unintentional dictation errors.    Joanne Gavel, MD 03/20/16 [id number]

## 2016-05-01 ENCOUNTER — Emergency Department
Admission: EM | Admit: 2016-05-01 | Discharge: 2016-05-01 | Disposition: A | Discharge status: Home / Self Care | Payer: BLUE CROSS/BLUE SHIELD | Attending: Emergency Medicine | Admitting: Emergency Medicine

## 2016-05-01 DIAGNOSIS — R079 Chest pain, unspecified: Secondary | ICD-10-CM

## 2016-05-01 DIAGNOSIS — I10 Essential (primary) hypertension: Secondary | ICD-10-CM | POA: Diagnosis not present

## 2016-05-01 DIAGNOSIS — R030 Elevated blood-pressure reading, without diagnosis of hypertension: Secondary | ICD-10-CM

## 2016-05-01 DIAGNOSIS — E039 Hypothyroidism, unspecified: Secondary | ICD-10-CM | POA: Insufficient documentation

## 2016-05-01 DIAGNOSIS — E119 Type 2 diabetes mellitus without complications: Secondary | ICD-10-CM | POA: Insufficient documentation

## 2016-05-01 DIAGNOSIS — F17210 Nicotine dependence, cigarettes, uncomplicated: Secondary | ICD-10-CM | POA: Insufficient documentation

## 2016-05-01 DIAGNOSIS — Z79899 Other long term (current) drug therapy: Secondary | ICD-10-CM | POA: Diagnosis not present

## 2016-05-01 DIAGNOSIS — Z8501 Personal history of malignant neoplasm of esophagus: Secondary | ICD-10-CM | POA: Diagnosis not present

## 2016-05-01 DIAGNOSIS — IMO0001 Reserved for inherently not codable concepts without codable children: Secondary | ICD-10-CM

## 2016-05-01 DIAGNOSIS — R0789 Other chest pain: Secondary | ICD-10-CM | POA: Diagnosis present

## 2016-05-01 MED ORDER — ACETAMINOPHEN 500 MG PO TABS
ORAL_TABLET | ORAL | Status: AC
  Administered 2016-05-01: 1000 mg via ORAL
  Filled 2016-05-01: qty 2

## 2016-05-01 MED ORDER — ACETAMINOPHEN 500 MG PO TABS
1000.0000 mg | ORAL_TABLET | Freq: Once | ORAL | Status: AC
  Administered 2016-05-01: 1000 mg via ORAL

## 2016-05-01 MED ORDER — OXYCODONE HCL 5 MG PO TABS
5.0000 mg | ORAL_TABLET | ORAL | Status: AC
  Administered 2016-05-01: 5 mg via ORAL
  Filled 2016-05-01: qty 1

## 2016-05-01 NOTE — ED Triage Notes (Signed)
Pt from home via EMS, reports chest/head pain today, pt also reports shes been hypertensive, states her drs have been trying to get it under control

## 2016-05-01 NOTE — ED Provider Notes (Signed)
[facility] Emergency Department Provider Note  ____________________________________________  Time seen: Approximately 10:15 PM  I have reviewed the triage vital signs and the nursing notes.   HISTORY  Chief Complaint Hypertension and Chest Pain    HPI Lacey May is a 45 y.o. female reports that she came here for evaluation of her blood pressure.  Patient tells me that today she is having a feeling of lightheadedness, she took her blood pressure at home and it was 180, she then began to experience a feeling of pressure across her chest. States she's been seeing her doctor and the bit increasing her Cardizem recently. She also reports that she was given nitroglycerin by paramedics, and started to get a headache afterwards. She notes a throbbing headache presently, but states she is not having any chest pain or discomfort.  Presently reports that she has a history of multiple sclerosis as well, but is not currently causing her any issues. The patient also tells me her blood pressures but very difficult to control with a fluctuating up and down regularly, which is minimally very difficult for doctors to get her on a blood pressure medicine that doesn't cause her blood pressure be too low at times. Denies any history of heart disease.     Past Medical History:  Diagnosis Date  . Diabetes mellitus without complication ([place])   . Esophageal cancer ([place])   . Fibromyalgia   . Hypertension   . MS (multiple sclerosis) ([place])   . Thyroid disease     Patient Active Problem List   Diagnosis Date Noted  . Palpitations 02/02/2015    Past Surgical History:  Procedure Laterality Date  . ABDOMINAL HYSTERECTOMY    . CESAREAN SECTION      Current Outpatient Rx  . Order #: [id number] Class: Historical Med  . Order #: [id number] Class: Historical Med  . Order #: [id number] Class: Historical Med  . Order #: [id number] Class: Historical Med  . Order #: [id number] Class:  Historical Med  . Order #: [id number] Class: Print  . Order #: [id number] Class: Historical Med  . Order #: [id number] Class: Historical Med  . Order #: [id number] Class: Historical Med  . Order #: [id number] Class: Historical Med  . Order #: [id number] Class: Historical Med  . Order #: [id number] Class: Historical Med  . Order #: [id number] Class: Historical Med  . Order #: [id number] Class: Historical Med  . Order #: [id number] Class: Historical Med  . Order #: [id number] Class: Historical Med  . Order #: [id number] Class: Historical Med  . Order #: [id number] Class: Historical Med  . Order #: [id number] Class: Normal  . Order #: [id number] Class: Print  . Order #: [id number] Class: Print  . Order #: [id number] Class: Print  . Order #: [id number] Class: Historical Med  . Order #: [id number] Class: Historical Med  . Order #: [id number] Class: Historical Med  . Order #: [id number] Class: Normal    Allergies Penicillins; Nefazodone; and Benzoyl peroxide  Family History  Problem Relation Age of Onset  . Adopted: Yes  . Cancer Mother   . Heart failure Father   . Hypertension Father   . Lupus Brother     Social History Social History  Substance Use Topics  . Smoking status: Current Every Day Smoker    Packs/day: 1.00    Types: Cigarettes  . Smokeless tobacco: Not on file  . Alcohol use No    Review of Systems Constitutional: No fever/chills Eyes: No visual changes. ENT: No sore throat. Cardiovascular: A slight feeling of tightness across the chest, she felt this starting after checking her blood  pressure and seems high. Patient tells me things thinks it might have been some anxiety as well Respiratory: Denies shortness of breath. Gastrointestinal: No abdominal pain.  No nausea, no vomiting.   Genitourinary: Negative for dysuria. Musculoskeletal: Negative for back pain. Skin: Negative for rash. Neurological: Negative for headaches, focal weakness or numbness except for a moderate throbbing headache which  occurred after receiving nitroglycerin.  10-point ROS otherwise negative.  ____________________________________________   PHYSICAL EXAM:  VITAL SIGNS: ED Triage Vitals  Enc Vitals Group     BP 05/01/16 1455 (!) 173/82     Pulse --      Resp 05/01/16 1455 16     Temp 05/01/16 1455 98.6 F (37 C)     Temp src --      SpO2 05/01/16 1455 100 %     Weight --      Height --      Head Circumference --      Peak Flow --      Pain Score 05/01/16 1445 8     Pain Loc --      Pain Edu? --      Excl. in [place]? --    Constitutional: Alert and oriented. Well appearing and in no acute distress. Eyes: Conjunctivae are normal. PERRL. EOMI. Head: Atraumatic. Nose: No congestion/rhinnorhea. Mouth/Throat: Mucous membranes are moist.  Oropharynx non-erythematous. Neck: No stridor.   Cardiovascular: Normal rate, regular rhythm. Grossly normal heart sounds.  Good peripheral circulation. Respiratory: Normal respiratory effort.  No retractions. Lungs CTAB. Gastrointestinal: Soft and nontender. No distention.  Musculoskeletal: No lower extremity tenderness nor edema.  Neurologic:  Normal speech and language. No gross focal neurologic deficits are appreciated. Skin:  Skin is warm, dry and intact. No rash noted. Psychiatric: Mood and affect are normal. Speech and behavior are normal.  ____________________________________________   LABS (all labs ordered are listed, but only abnormal results are displayed)  Labs Reviewed - No data to display ____________________________________________  EKG  Reviewed and interpreted IV at 1455 The trigger 80 QRS 60 QTc 420 Reviewed and interpreted as normal sinus rhythm, nonspecific T-wave abnormality noted in V3 through V5, possible questionable Q's in V1 and V2 ____________________________________________  RADIOLOGY   ____________________________________________   PROCEDURES  Procedure(s) performed: None  Critical Care performed:  No  ____________________________________________   INITIAL IMPRESSION / ASSESSMENT AND PLAN / ED COURSE  Pertinent labs & imaging results that were available during my care of the patient were reviewed by me and considered in my medical decision making (see chart for details).  Patient transfer evaluation of episode of feeling slightly lightheaded, and also chest pain which occurred after noting elevated blood pressure. Her blood pressure has improved, and all of her symptoms have resolved except for a moderate throbbing headache at the time of my presentation.  Reassuring and normal neurologic examination. Suspect her throbbing headache lately started after an due to nitroglycerin. She reports that Tylenol did not help, and I did give her oxycodone which she reported helped her pain.  Her EKG is reassuring though a mild non-specific TWI is seen. I planed to check labs for the patient and notified her that we wished to make sure she is not having a "heart attack", however she told nursing staff that she was tired of waiting in the ER and left shortly after my initial evaluation. The patient eloped from the emergency room without discussing with me. Unfortunately, the patient did notify the nurses and asked that RN notify her to  wait so I could speak to her regarding my plan of care and testing for today, however I was in the middle of performing a procedural sedation for a pediatric patient and was not able to immediately discussed with the patient prior to her leaving. The patient eloped.    Clinical Course   ____________________________________________   FINAL CLINICAL IMPRESSION(S) / ED DIAGNOSES  Final diagnoses:  None  Chest pain, elevated blood pressure, headache  Patient eloped from the emergency room    Delman Kitten, MD 05/01/16 2223

## 2016-05-01 NOTE — ED Triage Notes (Signed)
EMS gave 325 aspirin and 1 nitro

## 2016-05-01 NOTE — ED Notes (Signed)
Pt left AMA, pt states she would like her IV out and is tired of waiting. States that since her BP is down and her headache is gone she wants to leave. EDP made aware and IV taken out. Return precautions reviewed with pt and informed that she is always welcome to come back and be seen if needed

## 2016-08-07 DIAGNOSIS — Z87898 Personal history of other specified conditions: Secondary | ICD-10-CM | POA: Insufficient documentation

## 2017-02-18 DIAGNOSIS — F332 Major depressive disorder, recurrent severe without psychotic features: Secondary | ICD-10-CM | POA: Insufficient documentation

## 2017-06-04 ENCOUNTER — Ambulatory Visit: Payer: BLUE CROSS/BLUE SHIELD | Admitting: Maternal Newborn

## 2017-06-04 NOTE — Progress Notes (Deleted)
Gynecology Annual Exam  PCP: [facility]  Chief Complaint: No chief complaint on file.   History of Present Illness: Patient is a 46 y.o. G11P0000 presents for annual exam. The patient has no complaints today.   LMP: No LMP recorded. Patient has had a hysterectomy. Average Interval: {Desc; regular/irreg:14544}, {numbers 22-35:14824} days Duration of flow: {numbers; 0-10:33138} days Heavy Menses: {yes/no:63} Clots: {yes/no:63} Intermenstrual Bleeding: {yes/no:63} Postcoital Bleeding: {yes/no:63} Dysmenorrhea: {yes/no:63}   The patient {sys sexually active:13135} sexually active. She currently uses {method:5051} for contraception. She {has/denies:315300} dyspareunia.  The patient {DOES_DOES DQQ:22979} perform self breast exams.  There {is/is no:19420} notable family history of breast or ovarian cancer in her family.  The patient wears seatbelts: {yes/no:63}.   The patient has regular exercise: {yes/no/not asked:9010}.    The patient {Blank single:19197::"reports","denies"} current symptoms of depression.    Review of Systems: ROS  Past Medical History:  Past Medical History:  Diagnosis Date  . Diabetes mellitus without complication ([place])   . Esophageal cancer ([place])   . Fibromyalgia   . Hypertension   . MS (multiple sclerosis) ([place])   . Thyroid disease     Past Surgical History:  Past Surgical History:  Procedure Laterality Date  . ABDOMINAL HYSTERECTOMY    . CESAREAN SECTION      Gynecologic History:  No LMP recorded. Patient has had a hysterectomy. Contraception: {method:5051} Last Pap: Results were: *** {Findings; lab pap smear results:16707::"NIL and HR HPV+","NIL and HR HPV negative"}  Last mammogram: *** Results were: {Blank single:19197::"BI-RADS IV","BI-RAD III","BI-RAD II","BI-RAD I"} Obstetric History: G11P0000  Family History:  Family History  Problem Relation Age of Onset  . Adopted: Yes  . Cancer Mother   . Heart failure  Father   . Hypertension Father   . Lupus Brother     Social History:  Social History   Social History  . Marital status: Married    Spouse name: N/A  . Number of children: N/A  . Years of education: N/A   Occupational History  . Not on file.   Social History Main Topics  . Smoking status: Current Every Day Smoker    Packs/day: 1.00    Types: Cigarettes  . Smokeless tobacco: Not on file  . Alcohol use No  . Drug use: No  . Sexual activity: Yes   Other Topics Concern  . Not on file   Social History Narrative  . No narrative on file    Allergies:  Allergies  Allergen Reactions  . Penicillins Anaphylaxis    Other reaction(s): SHORTNESS OF BREATH  . Nefazodone Other (See Comments)    Tachacardia Other reaction(s): OTHER  . Benzoyl Peroxide Other (See Comments) and Rash    Medications: Prior to Admission medications   Medication Sig Start Date End Date Taking? Authorizing Provider  albuterol (PROVENTIL HFA;VENTOLIN HFA) 108 (90 BASE) MCG/ACT inhaler Frequency:PHARMDIR   Dosage:90   MCG  Instructions:  Note:2 puff q4-6 hrs prn shortness of breath or wheezingm use with spacer. Dose: 90MCG 06/18/11   [provider]  beclomethasone (QVAR) 80 MCG/ACT inhaler Inhale into the lungs. 01/11/15 01/11/16  [provider]  cetirizine (ZYRTEC) 10 MG tablet Take 10 mg by mouth. 12/11/11   [provider]  clonazePAM (KLONOPIN) 0.5 MG tablet Take 0.5 mg by mouth. 09/02/13   [provider]  clonazePAM (KLONOPIN) 1 MG disintegrating tablet Take by mouth.    [provider]  cyclobenzaprine (FLEXERIL) 10 MG tablet Take 1 tablet (  10 mg total) by mouth 3 (three) times daily as needed (for esophageal spasm). 03/25/15   Schaevitz, Randall An, MD  diltiazem (CARDIZEM CD) 120 MG 24 hr capsule Take 120 mg by mouth. 12/15/13   [provider]  doxycycline (ADOXA) 100 MG tablet Take 100 mg by mouth 2 (two) times daily.    [provider]   DULoxetine (CYMBALTA) 20 MG capsule Take by mouth. 08/30/14   [provider]  DULoxetine (CYMBALTA) 60 MG capsule Take 60 mg by mouth. 03/03/13   [provider]  esomeprazole (NEXIUM) 40 MG capsule Take 40 mg by mouth. 03/03/13   [provider]  Fluticasone-Salmeterol (ADVAIR) 100-50 MCG/DOSE AEPB Frequency:BID   Dosage:0.0     Instructions:  Note:Dose: 1 06/18/11   [provider]  gabapentin (NEURONTIN) 300 MG capsule Take 900 mg by mouth 3 (three) times daily. TAKE 2 CAPSULES BY MOUTH THREE TIMES DAILY 11/17/14   [provider]  HYDROcodone-acetaminophen (NORCO/VICODIN) 5-325 MG per tablet Take 1 tablet by mouth every 6 (six) hours as needed for moderate pain.    [provider]  hyoscyamine (LEVSIN, ANASPAZ) 0.125 MG tablet Frequency:TID   Dosage:0.125   MG  Instructions:  Note:Dose: 0.125MG  09/02/13   [provider]  ipratropium (ATROVENT HFA) 17 MCG/ACT inhaler Frequency:PHARMDIR   Dosage:0.0     Instructions:  Note:2 puffs q 6 hours Dose: 17MCG/SPRAY 06/18/11   [provider]  Ipratropium-Albuterol (COMBIVENT) 20-100 MCG/ACT AERS respimat Inhale 1 puff into the lungs every 6 (six) hours.    [provider]  montelukast (SINGULAIR) 10 MG tablet Take 10 mg by mouth. 01/21/12   [provider]  mupirocin ointment (BACTROBAN) 2 % Apply 1 application topically 3 (three) times daily. To scalp sores 05/14/15   Sherlene Shams, MD  nitroGLYCERIN (NITRODUR - DOSED IN MG/24 HR) 0.2 mg/hr patch Place 1 patch (0.2 mg total) onto the skin daily. 03/25/15   Schaevitz, Randall An, MD  orphenadrine (NORFLEX) 100 MG tablet Take 1 tablet (100 mg total) by mouth 2 (two) times daily. Did not take with Flexeril 12/20/15   Frederich Cha, MD  oxyCODONE-acetaminophen (ROXICET) 5-325 MG tablet Take 1 tablet by mouth every 8 (eight) hours as needed for moderate pain or severe pain (Do not drive or operate heavy machinery while taking  as can cause drowsiness.). 12/23/15   Marylene Land, NP  pregabalin (LYRICA) 150 MG capsule Take 150 mg by mouth. 03/25/13   [provider]  promethazine (PHENERGAN) 25 MG tablet Take 25 mg by mouth. 12/11/11   [provider]  traZODone (DESYREL) 50 MG tablet Take 50 mg by mouth. 03/03/13   [provider]  triamcinolone (KENALOG) 0.1 % paste Use as directed 1 application in the mouth or throat 3 (three) times daily. To sore in left upper jaw until healed 05/14/15   Sherlene Shams, MD    Physical Exam Vitals: There were no vitals taken for this visit.  General: NAD HEENT: normocephalic, anicteric Thyroid: no enlargement, no palpable nodules Pulmonary: No increased work of breathing, CTAB Cardiovascular: RRR, distal pulses 2+ Breast: Breast symmetrical, no tenderness, no palpable nodules or masses, no skin or nipple retraction present, no nipple discharge.  No axillary or supraclavicular lymphadenopathy. Abdomen: NABS, soft, non-tender, non-distended.  Umbilicus without lesions.  No hepatomegaly, splenomegaly or masses palpable. No evidence of hernia  Genitourinary:  External: Normal external female genitalia.  Normal urethral meatus, normal Bartholin's and Skene's glands.  Vagina: Normal vaginal mucosa, no evidence of prolapse.    Cervix: Grossly normal in appearance, no bleeding  Uterus: Non-enlarged, mobile, normal contour.  No CMT  Adnexa: ovaries non-enlarged, no adnexal masses  Rectal: deferred  Lymphatic: no evidence of inguinal lymphadenopathy Extremities: no edema, erythema, or tenderness Neurologic: Grossly intact Psychiatric: mood appropriate, affect full   Assessment: 46 y.o. G11P0000 routine annual exam  Plan: Problem List Items Addressed This Visit    None      1) Mammogram - recommend yearly screening mammogram.  Mammogram {Blank single:19197::"Is up to date","Was ordered today"}  2) STI screening was offered and {Blank  single:19197::"accepted","declined"}  3) ASCCP guidelines and rational discussed.  Patient opts for {Blank single:19197::"every 5 years","every 3 years","yearly","Discontinue"} screening interval  4) Contraception - Education given regarding options for contraception, including {contraceptive options (MU measure 33):20677::"NuvaRing","Nexplanon","Surgical Sterilization including vasectomy"}.  5) Colonoscopy -- Screening recommended starting at age 8 for average risk individuals, age 29 for individuals deemed at increased risk (including African Americans) and recommended to continue until age 39.  For patient age 46-85 individualized approach is recommended.  Gold standard screening is via colonoscopy, Cologuard screening is an acceptable alternative for patient unwilling or unable to undergo colonoscopy.  "Colorectal cancer screening for average?risk adults: 2018 guideline update from the American Cancer Society"[place]: A Cancer Journal for Clinicians: Mar 06, 2017   6) Routine healthcare maintenance including cholesterol, diabetes screening discussed {Blank single:19197::"managed by PCP","Ordered today","To return fasting at a later date","Declines"}

## 2017-06-12 ENCOUNTER — Encounter: Payer: Self-pay | Admitting: Cardiovascular Disease

## 2017-06-12 ENCOUNTER — Ambulatory Visit: Payer: BLUE CROSS/BLUE SHIELD | Admitting: Cardiovascular Disease

## 2017-06-13 IMAGING — CR DG RIBS W/ CHEST 3+V BILAT
5 series · 5 of 5 positions shown · non-contrast
Comparison: Chest radiograph performed 03/25/2015

CLINICAL DATA: Acute onset of right posterior chest pain and cough.
Initial encounter.

EXAM:
BILATERAL RIBS AND CHEST - 4+ VIEW

[chest pa]
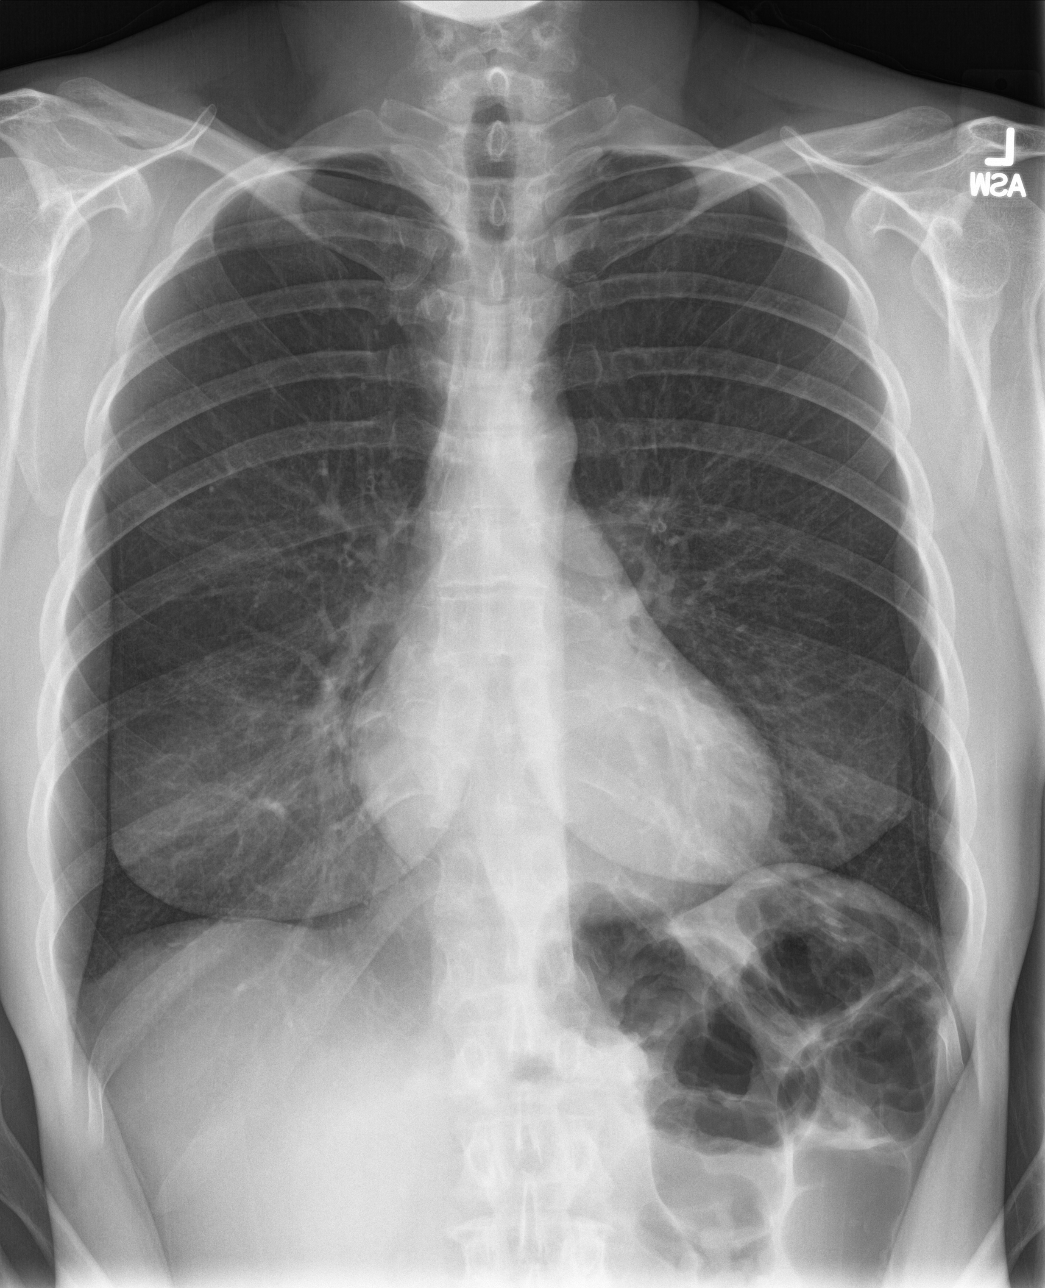

[rib ap (1 of 2)]
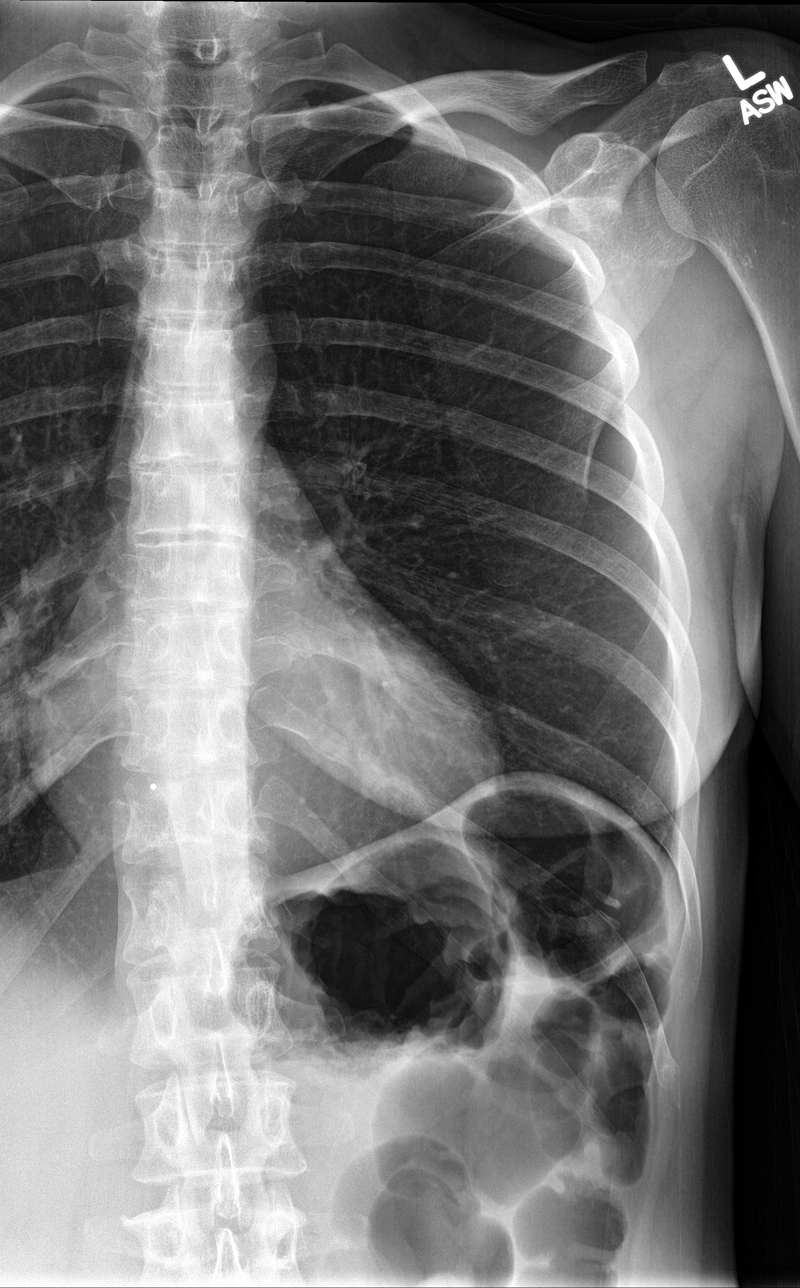

[rib ap (2 of 2)]
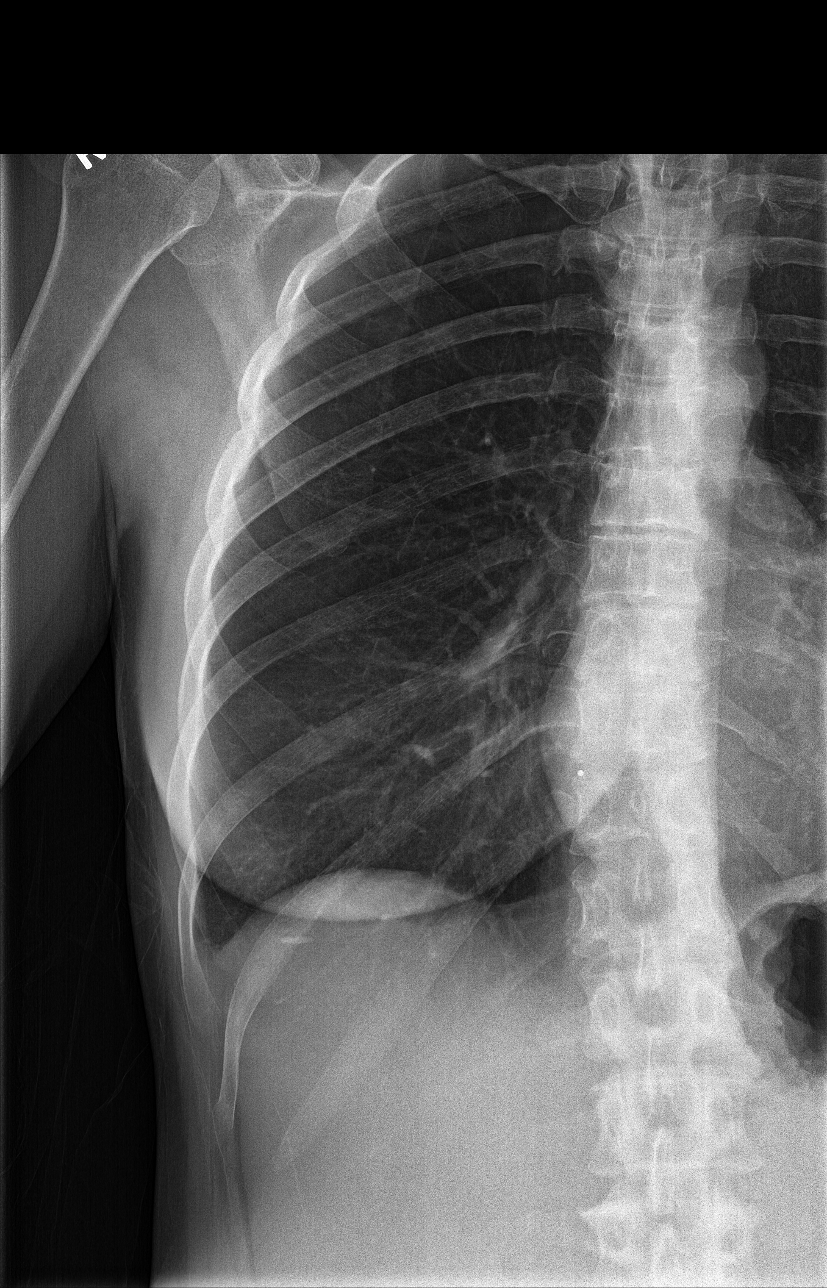

[rib obl (1 of 2)]
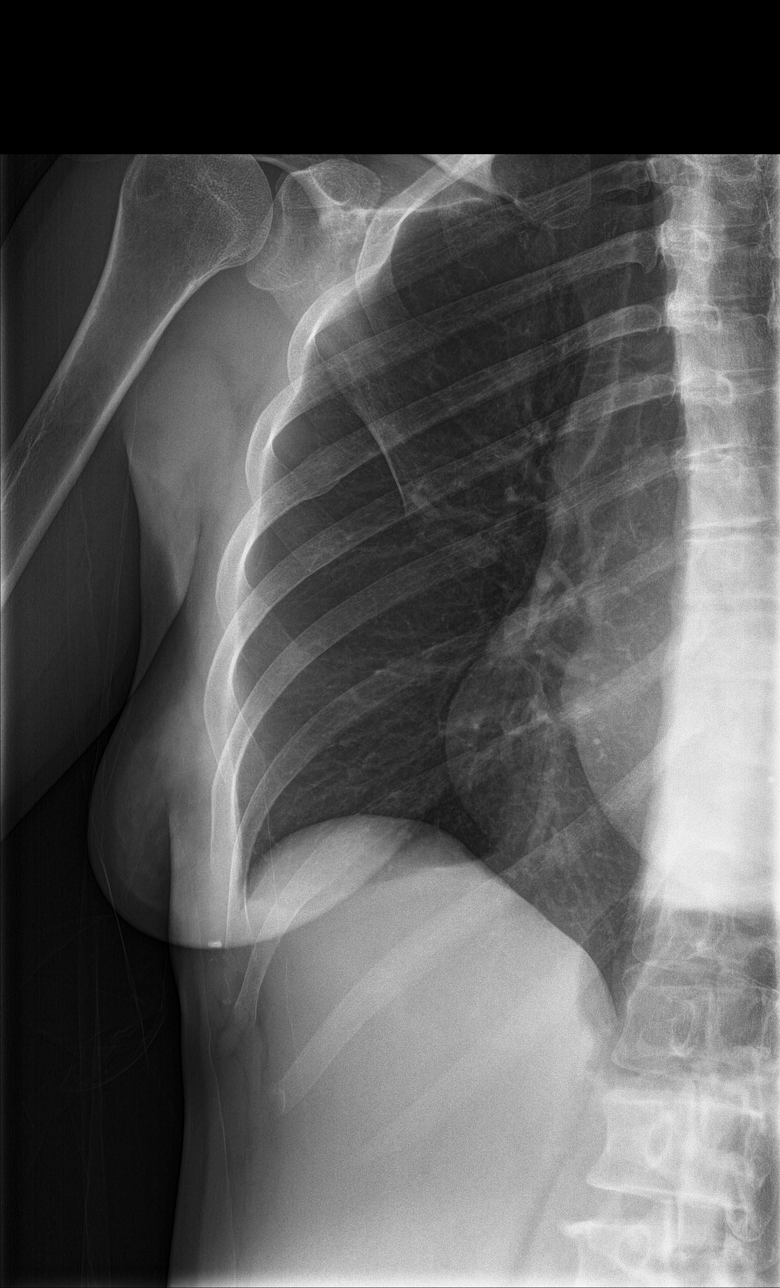

[rib obl (2 of 2)]
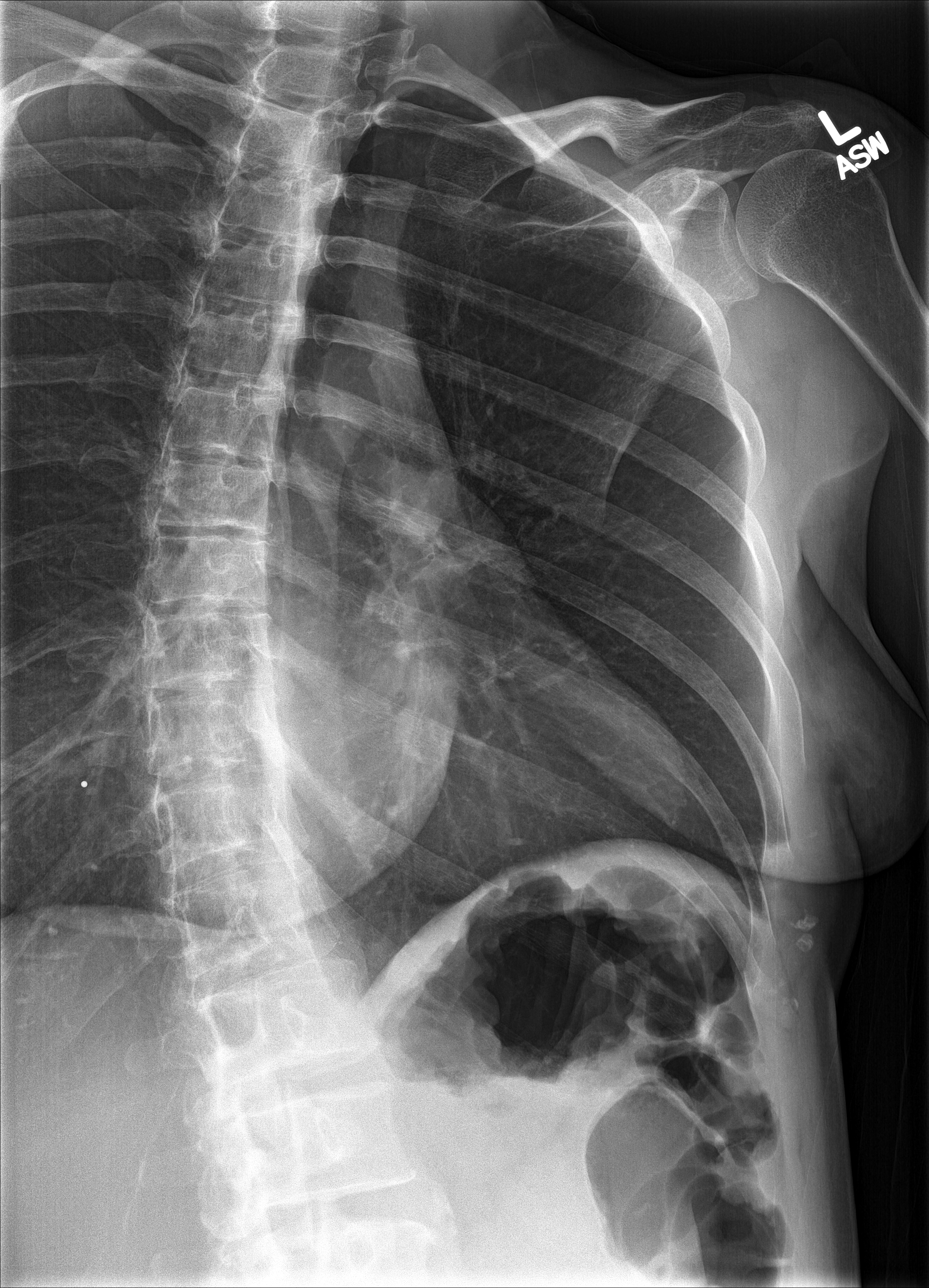

[5 of 5 positions shown; findings below may reference images not displayed]

FINDINGS: No displaced rib fractures are seen.

The lungs are well-aerated and clear. There is no evidence of focal
opacification, pleural effusion or pneumothorax.

The cardiomediastinal silhouette is within normal limits. No acute
osseous abnormalities are seen. There is mild chronic deformity of
the right posterior fifth rib.
IMPRESSION: No displaced rib fracture seen. No acute cardiopulmonary process
identified.

## 2018-02-11 ENCOUNTER — Ambulatory Visit (INDEPENDENT_AMBULATORY_CARE_PROVIDER_SITE_OTHER): Payer: BLUE CROSS/BLUE SHIELD | Admitting: Obstetrics & Gynecology

## 2018-02-11 ENCOUNTER — Encounter: Payer: Self-pay | Admitting: Obstetrics & Gynecology

## 2018-02-11 VITALS — BP 130/80 | Ht 64.0 in | Wt 116.0 lb

## 2018-02-11 DIAGNOSIS — N898 Other specified noninflammatory disorders of vagina: Secondary | ICD-10-CM | POA: Diagnosis not present

## 2018-02-11 DIAGNOSIS — N952 Postmenopausal atrophic vaginitis: Secondary | ICD-10-CM

## 2018-02-11 DIAGNOSIS — R1032 Left lower quadrant pain: Secondary | ICD-10-CM | POA: Diagnosis not present

## 2018-02-11 DIAGNOSIS — N643 Galactorrhea not associated with childbirth: Secondary | ICD-10-CM | POA: Insufficient documentation

## 2018-02-11 DIAGNOSIS — N939 Abnormal uterine and vaginal bleeding, unspecified: Secondary | ICD-10-CM | POA: Diagnosis not present

## 2018-02-11 NOTE — Progress Notes (Signed)
Obstetrics & Gynecology Office Visit   Chief Complaint:  Chief Complaint  Patient presents with  . Vaginal Discharge  . Dyspareunia  . Vaginal Bleeding  . Pelvic Pain    left side    History of Present Illness: 47 y.o. [id number] presenting for initial evaluation of dyspareunia, as well as vaginal dryness, LLQ pain, and even galactorrhea (one year).  Symptoms onset was several months to years ago and symptoms have been worsening.  The patient is menopausal.  She is not currently on any HRT or hormonal medications.    Pain is most pronounced with initial penetration.  She has postcoital spotting.  Last pap smear on 2014 was no abnormalities.  She denies a history of sexually transmitted infections.  Associated symptoms include local irritation, pain and post coital bleeding.  She denies recent antibiotic exposure, denies changes in soaps, detergents coinciding with the onset of her symptoms.  She has not previously self treated or been under treatment by another provider for these symptoms.    LMP 4 years ago, abrupt. Pregnancy 6 years ago, had tubes tied, had miscarriage since (per pt)  Review of Systems  Constitutional: Positive for malaise/fatigue. Negative for chills and fever.  HENT: Negative for congestion, sinus pain and sore throat.   Eyes: Negative for blurred vision and pain.  Respiratory: Positive for cough. Negative for wheezing.   Cardiovascular: Negative for chest pain and leg swelling.  Gastrointestinal: Negative for abdominal pain, constipation, diarrhea, heartburn, nausea and vomiting.  Genitourinary: Negative for dysuria, frequency, hematuria and urgency.  Musculoskeletal: Negative for back pain, joint pain, myalgias and neck pain.  Skin: Negative for itching and rash.  Neurological: Positive for dizziness, tingling, weakness and headaches. Negative for tremors.  Endo/Heme/Allergies: Bruises/bleeds easily.  Psychiatric/Behavioral: Negative for depression. The  patient is not nervous/anxious and does not have insomnia.     Past Medical History:  Past Medical History:  Diagnosis Date  . Diabetes mellitus without complication ([place])   . Esophageal cancer ([place])   . Fibromyalgia   . Hypertension   . MS (multiple sclerosis) ([place])   . Thyroid disease     Past Surgical History:  Past Surgical History:  Procedure Laterality Date  . ABDOMINAL HYSTERECTOMY    . CESAREAN SECTION      Gynecologic History: No LMP recorded. Patient has had a hysterectomy.  Obstetric History: [id number]  Family History:  Family History  Adopted: Yes  Problem Relation Age of Onset  . Cancer Mother   . Heart failure Father   . Hypertension Father   . Lupus Brother     Social History:  Social History   Socioeconomic History  . Marital status: Married    Spouse name: Not on file  . Number of children: Not on file  . Years of education: Not on file  . Highest education level: Not on file  Occupational History  . Not on file  Social Needs  . Financial resource strain: Not on file  . Food insecurity:    Worry: Not on file    Inability: Not on file  . Transportation needs:    Medical: Not on file    Non-medical: Not on file  Tobacco Use  . Smoking status: Current Every Day Smoker    Packs/day: 1.00    Types: Cigarettes  . Smokeless tobacco: Never Used  Substance and Sexual Activity  . Alcohol use: No    Alcohol/week: 0.0 oz  . Drug use: No  .  Sexual activity: Yes  Lifestyle  . Physical activity:    Days per week: Not on file    Minutes per session: Not on file  . Stress: Not on file  Relationships  . Social connections:    Talks on phone: Not on file    Gets together: Not on file    Attends religious service: Not on file    Active member of club or organization: Not on file    Attends meetings of clubs or organizations: Not on file    Relationship status: Not on file  . Intimate partner violence:    Fear of current or ex partner: Not on  file    Emotionally abused: Not on file    Physically abused: Not on file    Forced sexual activity: Not on file  Other Topics Concern  . Not on file  Social History Narrative  . Not on file    Allergies:  Allergies  Allergen Reactions  . Penicillins Anaphylaxis    Other reaction(s): SHORTNESS OF BREATH  . Nefazodone Other (See Comments)    Tachacardia Other reaction(s): OTHER  . Benzoyl Peroxide Other (See Comments) and Rash    Medications: Prior to Admission medications   Medication Sig Start Date End Date Taking? Authorizing Provider  gabapentin (NEURONTIN) 300 MG capsule Take 900 mg by mouth 3 (three) times daily. TAKE 2 CAPSULES BY MOUTH THREE TIMES DAILY 11/17/14  Yes [provider]  albuterol (PROVENTIL HFA;VENTOLIN HFA) 108 (90 BASE) MCG/ACT inhaler Frequency:PHARMDIR   Dosage:90   MCG  Instructions:  Note:2 puff q4-6 hrs prn shortness of breath or wheezingm use with spacer. Dose: 90MCG 06/18/11   [provider]  beclomethasone (QVAR) 80 MCG/ACT inhaler Inhale into the lungs. 01/11/15 01/11/16  [provider]  cetirizine (ZYRTEC) 10 MG tablet Take 10 mg by mouth. 12/11/11   [provider]  clonazePAM (KLONOPIN) 0.5 MG tablet Take 0.5 mg by mouth. 09/02/13   [provider]  clonazePAM (KLONOPIN) 1 MG disintegrating tablet Take by mouth.    [provider]  cyclobenzaprine (FLEXERIL) 10 MG tablet Take 1 tablet (10 mg total) by mouth 3 (three) times daily as needed (for esophageal spasm). Patient not taking: Reported on 02/11/2018 03/25/15   Schaevitz, Randall An, MD  diltiazem (CARDIZEM CD) 120 MG 24 hr capsule Take 120 mg by mouth. 12/15/13   [provider]  doxycycline (ADOXA) 100 MG tablet Take 100 mg by mouth 2 (two) times daily.    [provider]  DULoxetine (CYMBALTA) 20 MG capsule Take by mouth. 08/30/14   [provider]  DULoxetine (CYMBALTA) 60 MG capsule Take 60 mg by mouth. 03/03/13    [provider]  esomeprazole (NEXIUM) 40 MG capsule Take 40 mg by mouth. 03/03/13   [provider]  Fluticasone-Salmeterol (ADVAIR) 100-50 MCG/DOSE AEPB Frequency:BID   Dosage:0.0     Instructions:  Note:Dose: 1 06/18/11   [provider]  HYDROcodone-acetaminophen (NORCO/VICODIN) 5-325 MG per tablet Take 1 tablet by mouth every 6 (six) hours as needed for moderate pain.    [provider]  hyoscyamine (LEVSIN, ANASPAZ) 0.125 MG tablet Frequency:TID   Dosage:0.125   MG  Instructions:  Note:Dose: 0.125MG  09/02/13   [provider]  ipratropium (ATROVENT HFA) 17 MCG/ACT inhaler Frequency:PHARMDIR   Dosage:0.0     Instructions:  Note:2 puffs q 6 hours Dose: 17MCG/SPRAY 06/18/11   [provider]  Ipratropium-Albuterol (COMBIVENT) 20-100 MCG/ACT AERS respimat Inhale 1 puff into  the lungs every 6 (six) hours.    [provider]  montelukast (SINGULAIR) 10 MG tablet Take 10 mg by mouth. 01/21/12   [provider]  mupirocin ointment (BACTROBAN) 2 % Apply 1 application topically 3 (three) times daily. To scalp sores Patient not taking: Reported on 02/11/2018 05/14/15   Wynona Luna, MD  nitroGLYCERIN (NITRODUR - DOSED IN MG/24 HR) 0.2 mg/hr patch Place 1 patch (0.2 mg total) onto the skin daily. Patient not taking: Reported on 02/11/2018 03/25/15   Orbie Pyo, MD  orphenadrine (NORFLEX) 100 MG tablet Take 1 tablet (100 mg total) by mouth 2 (two) times daily. Did not take with Flexeril Patient not taking: Reported on 02/11/2018 12/20/15   Frederich Cha, MD  oxyCODONE-acetaminophen (ROXICET) 5-325 MG tablet Take 1 tablet by mouth every 8 (eight) hours as needed for moderate pain or severe pain (Do not drive or operate heavy machinery while taking as can cause drowsiness.). Patient not taking: Reported on 02/11/2018 12/23/15   Marylene Land, NP  pregabalin (LYRICA) 150 MG capsule Take 150 mg by mouth. 03/25/13   [provider]  promethazine (PHENERGAN) 25 MG tablet Take 25 mg by mouth. 12/11/11   [provider]  traZODone (DESYREL) 50 MG tablet Take 50 mg by mouth. 03/03/13   [provider]  triamcinolone (KENALOG) 0.1 % paste Use as directed 1 application in the mouth or throat 3 (three) times daily. To sore in left upper jaw until healed Patient not taking: Reported on 02/11/2018 05/14/15   Wynona Luna, MD    Physical Exam Blood pressure 130/80, height 5\' 4"  (1.626 m), weight 116 lb (52.6 kg).  No LMP recorded. Patient has had a hysterectomy.  General: NAD HEENT: normocephalic, anicteric Thyroid: no enlargement, no palpable nodules Pulmonary: No increased work of breathing Cardiovascular: RRR, distal pulses 2+ Abdomen: NABS, soft, mild LLQ T, non-distended.  Umbilicus without lesions.  No hepatomegaly, splenomegaly or masses palpable. No evidence of hernia  Genitourinary:  External: Normal external female genitalia.  Normal urethral meatus, normal  Bartholin's and Skene's glands.    Vagina: Normal vaginal mucosa, no evidence of prolapse.    Cervix: Grossly normal in appearance, no bleeding  Uterus: Non-enlarged, mobile, normal contour.  No CMT  Adnexa: ovaries non-enlarged, no adnexal masses, some T in Left adnexa  Rectal: deferred  Lymphatic: no evidence of inguinal lymphadenopathy Extremities: no edema, erythema, or tenderness Neurologic: Grossly intact Psychiatric: mood appropriate, affect full  Female chaperone present for pelvic  portions of the physical exam  Assessment: 47 y.o. [id number]- Patient has several new concerns  Plan: Problem List Items Addressed This Visit      Genitourinary   Abnormal uterine bleeding   Relevant Orders   US PELVIS TRANSVANGINAL NON-OB (TV ONLY)   Prolactin   TSH   FSH/LH   Estradiol   Hemoglobin A1c   Beta hCG quant (ref lab)   T4, free   Vaginal atrophy   Relevant Orders   NuSwab Vaginitis Plus (VG+)     Other    LLQ pain - Primary   Relevant Orders   NuSwab Vaginitis Plus (VG+) [place]   Galactorrhea   Relevant Orders   Prolactin   TSH   FSH/LH   Estradiol   Hemoglobin A1c   Beta hCG quant (ref lab)   T4, free    Other Visit Diagnoses    Vaginal discharge       Relevant Orders   NuSwab Vaginitis Plus (  VG+)    PAP done as overdue. F/u after labs, [place]  Barnett Applebaum, MD, [facility], [facility] 02/11/2018  11:52 AM

## 2018-02-12 LAB — FSH/LH
FSH: 117.4 m[IU]/mL
LH: 45.7 m[IU]/mL

## 2018-02-12 LAB — HEMOGLOBIN A1C
Est. average glucose Bld gHb Est-mCnc: 111 mg/dL
Hgb A1c MFr Bld: 5.5 % (ref 4.8–5.6)

## 2018-02-12 LAB — PAP IG (IMAGE GUIDED): PAP Smear Comment: 0

## 2018-02-12 LAB — BETA HCG QUANT (REF LAB): hCG Quant: 6 m[IU]/mL

## 2018-02-12 LAB — ESTRADIOL: Estradiol: <5 pg/mL

## 2018-02-12 LAB — T4, FREE: Free T4: 0.69 ng/dL — ABNORMAL LOW (ref 0.82–1.77)

## 2018-02-12 LAB — PROLACTIN: Prolactin: 6.5 ng/mL (ref 4.8–23.3)

## 2018-02-12 LAB — TSH: TSH: 0.433 u[IU]/mL — AB (ref 0.450–4.500)

## 2018-02-14 LAB — NUSWAB VAGINITIS PLUS (VG+)
CANDIDA GLABRATA, NAA: NEGATIVE
Candida albicans, NAA: NEGATIVE
Chlamydia trachomatis, NAA: NEGATIVE
NEISSERIA GONORRHOEAE, NAA: NEGATIVE
Trich vag by NAA: POSITIVE — AB

## 2018-02-17 ENCOUNTER — Telehealth: Payer: Self-pay | Admitting: Obstetrics & Gynecology

## 2018-02-17 NOTE — Progress Notes (Signed)
LM.  Rec tx for Trich. [place] soon and will address blood lab changes at appt.

## 2018-02-17 NOTE — Telephone Encounter (Signed)
Patient returning [facility] call.  She says for the next few days she is only available to talk between 12-1 or after 5, otherwise she cannot answer her phone if cb could be between these times.

## 2018-02-20 ENCOUNTER — Other Ambulatory Visit: Payer: Self-pay | Admitting: Obstetrics & Gynecology

## 2018-02-20 DIAGNOSIS — E0590 Thyrotoxicosis, unspecified without thyrotoxic crisis or storm: Secondary | ICD-10-CM

## 2018-02-20 DIAGNOSIS — R7989 Other specified abnormal findings of blood chemistry: Secondary | ICD-10-CM

## 2018-02-20 MED ORDER — METRONIDAZOLE 500 MG PO TABS: 2000.0000 mg | ORAL_TABLET | Freq: Once | ORAL | 1 refills | Status: AC

## 2018-02-20 NOTE — Progress Notes (Signed)
Phone- D/w pt lab work Tx for [employer] discussed.    Sexual abstinance Referral for abn thy testing discussed and done Pt o follow up here for [place] next week  Barnett Applebaum, MD, [facility] 02/20/2018  9:32 AM

## 2018-02-26 ENCOUNTER — Ambulatory Visit: Payer: Self-pay | Admitting: Obstetrics & Gynecology

## 2018-02-26 ENCOUNTER — Other Ambulatory Visit: Payer: Self-pay

## 2018-03-11 ENCOUNTER — Ambulatory Visit: Payer: Self-pay | Admitting: Obstetrics & Gynecology

## 2018-03-11 ENCOUNTER — Ambulatory Visit: Payer: Self-pay

## 2018-10-17 ENCOUNTER — Other Ambulatory Visit: Payer: Self-pay | Admitting: Obstetrics & Gynecology

## 2018-10-31 ENCOUNTER — Ambulatory Visit
Admission: EM | Admit: 2018-10-31 | Discharge: 2018-10-31 | Disposition: A | Discharge status: Home / Self Care | Payer: BLUE CROSS/BLUE SHIELD | Attending: Family Medicine | Admitting: Family Medicine

## 2018-10-31 ENCOUNTER — Other Ambulatory Visit: Payer: Self-pay

## 2018-10-31 DIAGNOSIS — M940 Chondrocostal junction syndrome [Tietze]: Secondary | ICD-10-CM

## 2018-10-31 DIAGNOSIS — Z720 Tobacco use: Secondary | ICD-10-CM

## 2018-10-31 MED ORDER — KETOROLAC TROMETHAMINE 10 MG PO TABS: 10.0000 mg | ORAL_TABLET | Freq: Three times a day (TID) | ORAL | 0 refills | Status: DC | PRN

## 2018-10-31 NOTE — ED Triage Notes (Addendum)
Pt states she awoke feeling like she was punched in the chest. Very sore muscles in chest, and hurts with movement. Tender to touch. Pain 8/10. Able to reproduce the pain by turning and deep breathing

## 2018-10-31 NOTE — ED Provider Notes (Signed)
[facility]    CSN: [id number] Arrival date & time: 10/31/18  1728     History   Chief Complaint Chief Complaint  Patient presents with  . Muscle Pain    HPI Lacey May is a 48 y.o. female.   48 yo female with a c/o chest wall pain since this morning. Pain is sharp and worse with movement, palpation or deep breathing. Denies any rash, fevers, chills, shortness of breath, diaphoresis, neck or jaw pain.   The history is provided by the patient.  Muscle Pain     Past Medical History:  Diagnosis Date  . Diabetes mellitus without complication ([place])   . Esophageal cancer ([place])   . Fibromyalgia   . Hypertension   . MS (multiple sclerosis) ([place])   . Thyroid disease     Patient Active Problem List   Diagnosis Date Noted  . LLQ pain 02/11/2018  . Abnormal uterine bleeding 02/11/2018  . Vaginal atrophy 02/11/2018  . Galactorrhea 02/11/2018  . Palpitations 02/02/2015    Past Surgical History:  Procedure Laterality Date  . ABDOMINAL HYSTERECTOMY    . CESAREAN SECTION      OB History    Gravida  11   Para  6   Term      Preterm  6   AB  5   Living  6     SAB  4   TAB      Ectopic  1   Multiple      Live Births               Home Medications    Prior to Admission medications   Medication Sig Start Date End Date Taking? Authorizing Provider  albuterol (PROVENTIL HFA;VENTOLIN HFA) 108 (90 BASE) MCG/ACT inhaler Frequency:PHARMDIR   Dosage:90   MCG  Instructions:  Note:2 puff q4-6 hrs prn shortness of breath or wheezingm use with spacer. Dose: 90MCG 06/18/11   [provider]  beclomethasone (QVAR) 80 MCG/ACT inhaler Inhale into the lungs. 01/11/15 01/11/16  [provider]  cetirizine (ZYRTEC) 10 MG tablet Take 10 mg by mouth. 12/11/11   [provider]  clonazePAM (KLONOPIN) 0.5 MG tablet Take 0.5 mg by mouth. 09/02/13   [provider]  clonazePAM (KLONOPIN) 1 MG disintegrating tablet Take by mouth.     [provider]  cyclobenzaprine (FLEXERIL) 10 MG tablet Take 1 tablet (10 mg total) by mouth 3 (three) times daily as needed (for esophageal spasm). Patient not taking: Reported on 02/11/2018 03/25/15   Schaevitz, Randall An, MD  diltiazem (CARDIZEM CD) 120 MG 24 hr capsule Take 120 mg by mouth. 12/15/13   [provider]  doxycycline (ADOXA) 100 MG tablet Take 100 mg by mouth 2 (two) times daily.    [provider]  DULoxetine (CYMBALTA) 20 MG capsule Take by mouth. 08/30/14   [provider]  DULoxetine (CYMBALTA) 60 MG capsule Take 60 mg by mouth. 03/03/13   [provider]  esomeprazole (NEXIUM) 40 MG capsule Take 40 mg by mouth. 03/03/13   [provider]  Fluticasone-Salmeterol (ADVAIR) 100-50 MCG/DOSE AEPB Frequency:BID   Dosage:0.0     Instructions:  Note:Dose: 1 06/18/11   [provider]  gabapentin (NEURONTIN) 300 MG capsule Take 900 mg by mouth 3 (three) times daily. TAKE 2 CAPSULES BY MOUTH THREE TIMES DAILY 11/17/14   [provider]  HYDROcodone-acetaminophen (NORCO/VICODIN) 5-325 MG per tablet Take 1 tablet by mouth every 6 (six) hours  as needed for moderate pain.    [provider]  hyoscyamine (LEVSIN, ANASPAZ) 0.125 MG tablet Frequency:TID   Dosage:0.125   MG  Instructions:  Note:Dose: 0.125MG  09/02/13   [provider]  ipratropium (ATROVENT HFA) 17 MCG/ACT inhaler Frequency:PHARMDIR   Dosage:0.0     Instructions:  Note:2 puffs q 6 hours Dose: 17MCG/SPRAY 06/18/11   [provider]  Ipratropium-Albuterol (COMBIVENT) 20-100 MCG/ACT AERS respimat Inhale 1 puff into the lungs every 6 (six) hours.    [provider]  ketorolac (TORADOL) 10 MG tablet Take 1 tablet (10 mg total) by mouth every 8 (eight) hours as needed. 10/31/18   Norval Gable, MD  montelukast (SINGULAIR) 10 MG tablet Take 10 mg by mouth. 01/21/12   [provider]  mupirocin ointment (BACTROBAN) 2 % Apply 1  application topically 3 (three) times daily. To scalp sores Patient not taking: Reported on 02/11/2018 05/14/15   Wynona Luna, MD  nitroGLYCERIN (NITRODUR - DOSED IN MG/24 HR) 0.2 mg/hr patch Place 1 patch (0.2 mg total) onto the skin daily. Patient not taking: Reported on 02/11/2018 03/25/15   Orbie Pyo, MD  orphenadrine (NORFLEX) 100 MG tablet Take 1 tablet (100 mg total) by mouth 2 (two) times daily. Did not take with Flexeril Patient not taking: Reported on 02/11/2018 12/20/15   Frederich Cha, MD  oxyCODONE-acetaminophen (ROXICET) 5-325 MG tablet Take 1 tablet by mouth every 8 (eight) hours as needed for moderate pain or severe pain (Do not drive or operate heavy machinery while taking as can cause drowsiness.). Patient not taking: Reported on 02/11/2018 12/23/15   Marylene Land, NP  pregabalin (LYRICA) 150 MG capsule Take 150 mg by mouth. 03/25/13   [provider]  promethazine (PHENERGAN) 25 MG tablet Take 25 mg by mouth. 12/11/11   [provider]  traZODone (DESYREL) 50 MG tablet Take 50 mg by mouth. 03/03/13   [provider]  triamcinolone (KENALOG) 0.1 % paste Use as directed 1 application in the mouth or throat 3 (three) times daily. To sore in left upper jaw until healed Patient not taking: Reported on 02/11/2018 05/14/15   Wynona Luna, MD    Family History Family History  Adopted: Yes  Problem Relation Age of Onset  . Cancer Mother   . Heart failure Father   . Hypertension Father   . Lupus Brother     Social History Social History   Tobacco Use  . Smoking status: Current Every Day Smoker    Packs/day: 0.50    Types: Cigarettes  . Smokeless tobacco: Never Used  Substance Use Topics  . Alcohol use: No    Alcohol/week: 0.0 standard drinks  . Drug use: No     Allergies   Penicillins; Nefazodone; and Benzoyl peroxide   Review of Systems Review of Systems   Physical Exam Triage Vital Signs ED Triage Vitals  Enc Vitals  Group     BP 10/31/18 1810 (!) 176/99     Pulse Rate 10/31/18 1810 79     Resp 10/31/18 1810 16     Temp 10/31/18 1810 98.7 F (37.1 C)     Temp Source 10/31/18 1810 Oral     SpO2 10/31/18 1810 100 %     Weight 10/31/18 1812 108 lb (49 kg)     Height 10/31/18 1812 5\' 4"  (1.626 m)     Head Circumference --      Peak Flow --      Pain Score 10/31/18  1812 8     Pain Loc --      Pain Edu? --      Excl. in [place]? --    No data found.  Updated Vital Signs BP (!) 176/99 (BP Location: Left Arm)   Pulse 79   Temp 98.7 F (37.1 C) (Oral)   Resp 16   Ht 5\' 4"  (1.626 m)   Wt 49 kg   SpO2 100%   BMI 18.54 kg/m   Visual Acuity Right Eye Distance:   Left Eye Distance:   Bilateral Distance:    Right Eye Near:   Left Eye Near:    Bilateral Near:     Physical Exam Vitals signs and nursing note reviewed.  Constitutional:      General: She is not in acute distress.    Appearance: Normal appearance. She is not ill-appearing, toxic-appearing or diaphoretic.  Cardiovascular:     Rate and Rhythm: Normal rate and regular rhythm.     Pulses: Normal pulses.     Heart sounds: Normal heart sounds.  Pulmonary:     Effort: Pulmonary effort is normal. No respiratory distress.     Breath sounds: Normal breath sounds. No stridor. No wheezing, rhonchi or rales.  Chest:     Chest wall: Tenderness (reproducible) present.  Neurological:     Mental Status: She is alert.      UC Treatments / Results  Labs (all labs ordered are listed, but only abnormal results are displayed) Labs Reviewed - No data to display  EKG None  Radiology No results found.  Procedures Procedures (including critical care time)  Medications Ordered in UC Medications - No data to display  Initial Impression / Assessment and Plan / UC Course  I have reviewed the triage vital signs and the nursing notes.  Pertinent labs & imaging results that were available during my care of the patient were reviewed by me and  considered in my medical decision making (see chart for details).      Final Clinical Impressions(s) / UC Diagnoses   Final diagnoses:  Costochondritis     Discharge Instructions     Rest, ice    ED Prescriptions    Medication Sig Dispense Auth. Provider   ketorolac (TORADOL) 10 MG tablet Take 1 tablet (10 mg total) by mouth every 8 (eight) hours as needed. 15 tablet Norval Gable, MD     1. diagnosis reviewed with patient 2. rx as per orders above; reviewed possible side effects, interactions, risks and benefits  3. Recommend supportive treatment as above 4. Follow-up prn if symptoms worsen or don't improve   Controlled Substance Prescriptions [place] Controlled Substance Registry consulted? Not Applicable   Norval Gable, MD 10/31/18 [phone]

## 2018-10-31 NOTE — Discharge Instructions (Signed)
Rest, ice

## 2019-01-27 ENCOUNTER — Emergency Department
Admission: EM | Admit: 2019-01-27 | Discharge: 2019-01-28 | Disposition: A | Discharge status: Home / Self Care | Payer: Self-pay | Attending: Emergency Medicine | Admitting: Emergency Medicine

## 2019-01-27 ENCOUNTER — Other Ambulatory Visit: Payer: Self-pay

## 2019-01-27 ENCOUNTER — Encounter: Payer: Self-pay | Admitting: *Deleted

## 2019-01-27 DIAGNOSIS — I10 Essential (primary) hypertension: Secondary | ICD-10-CM | POA: Insufficient documentation

## 2019-01-27 DIAGNOSIS — F17210 Nicotine dependence, cigarettes, uncomplicated: Secondary | ICD-10-CM | POA: Insufficient documentation

## 2019-01-27 DIAGNOSIS — E119 Type 2 diabetes mellitus without complications: Secondary | ICD-10-CM | POA: Insufficient documentation

## 2019-01-27 DIAGNOSIS — J181 Lobar pneumonia, unspecified organism: Secondary | ICD-10-CM

## 2019-01-27 DIAGNOSIS — G35 Multiple sclerosis: Secondary | ICD-10-CM | POA: Insufficient documentation

## 2019-01-27 DIAGNOSIS — Z20828 Contact with and (suspected) exposure to other viral communicable diseases: Secondary | ICD-10-CM | POA: Insufficient documentation

## 2019-01-27 DIAGNOSIS — J189 Pneumonia, unspecified organism: Secondary | ICD-10-CM | POA: Insufficient documentation

## 2019-01-27 DIAGNOSIS — Z79899 Other long term (current) drug therapy: Secondary | ICD-10-CM | POA: Insufficient documentation

## 2019-01-27 MED ORDER — ACETAMINOPHEN 500 MG PO TABS
1000.0000 mg | ORAL_TABLET | Freq: Once | ORAL | Status: DC
  Filled 2019-01-27: qty 2

## 2019-01-27 MED ORDER — SODIUM CHLORIDE 0.9 % IV BOLUS
500.0000 mL | Freq: Once | INTRAVENOUS | Status: AC
  Administered 2019-01-28: 01:00:00 500 mL via INTRAVENOUS

## 2019-01-27 NOTE — ED Triage Notes (Signed)
Pt ambulatory to triage. Pt reports a headache and chills.  No cough   No fever.  Pt reports nausea.  Pt alert   Speech clear.

## 2019-01-28 ENCOUNTER — Emergency Department: Payer: Self-pay

## 2019-01-28 LAB — CBC WITH DIFFERENTIAL/PLATELET
Abs Immature Granulocytes: 0.01 10*3/uL (ref 0.00–0.07)
Basophils Absolute: 0.1 10*3/uL (ref 0.0–0.1)
Basophils Relative: 1 %
Eosinophils Absolute: 0.3 10*3/uL (ref 0.0–0.5)
Eosinophils Relative: 3 %
HCT: 28.7 % — ABNORMAL LOW (ref 36.0–46.0)
Hemoglobin: 8.4 g/dL — ABNORMAL LOW (ref 12.0–15.0)
Immature Granulocytes: 0 %
Lymphocytes Relative: 31 %
Lymphs Abs: 2.5 10*3/uL (ref 0.7–4.0)
MCH: 22 pg — ABNORMAL LOW (ref 26.0–34.0)
MCHC: 29.3 g/dL — ABNORMAL LOW (ref 30.0–36.0)
MCV: 75.3 fL — ABNORMAL LOW (ref 80.0–100.0)
Monocytes Absolute: 0.6 10*3/uL (ref 0.1–1.0)
Monocytes Relative: 8 %
Neutro Abs: 4.6 10*3/uL (ref 1.7–7.7)
Neutrophils Relative %: 57 %
Platelets: 175 10*3/uL (ref 150–400)
RBC: 3.81 MIL/uL — ABNORMAL LOW (ref 3.87–5.11)
RDW: 19.8 % — ABNORMAL HIGH (ref 11.5–15.5)
WBC: 8 10*3/uL (ref 4.0–10.5)
nRBC: 0 % (ref 0.0–0.2)

## 2019-01-28 LAB — COMPREHENSIVE METABOLIC PANEL
ALT: 14 U/L (ref 0–44)
AST: 19 U/L (ref 15–41)
Albumin: 3.4 g/dL — ABNORMAL LOW (ref 3.5–5.0)
Alkaline Phosphatase: 41 U/L (ref 38–126)
Anion gap: 9 (ref 5–15)
BUN: 15 mg/dL (ref 6–20)
CO2: 23 mmol/L (ref 22–32)
Calcium: 8.8 mg/dL — ABNORMAL LOW (ref 8.9–10.3)
Chloride: 109 mmol/L (ref 98–111)
Creatinine, Ser: 0.84 mg/dL (ref 0.44–1.00)
GFR calc Af Amer: >60 mL/min (ref >60)
GFR calc non Af Amer: >60 mL/min (ref >60)
Glucose, Bld: 87 mg/dL (ref 70–99)
Potassium: 4 mmol/L (ref 3.5–5.1)
Sodium: 141 mmol/L (ref 135–145)
Total Bilirubin: 0.3 mg/dL (ref 0.3–1.2)
Total Protein: 6.8 g/dL (ref 6.5–8.1)

## 2019-01-28 LAB — TRIGLYCERIDES: Triglycerides: 97 mg/dL (ref <150)

## 2019-01-28 LAB — LACTIC ACID, PLASMA: Lactic Acid, Venous: 0.4 mmol/L — ABNORMAL LOW (ref 0.5–1.9)

## 2019-01-28 LAB — FIBRINOGEN: Fibrinogen: 359 mg/dL (ref 210–475)

## 2019-01-28 LAB — C-REACTIVE PROTEIN: CRP: <0.8 mg/dL (ref <1.0)

## 2019-01-28 LAB — FERRITIN: Ferritin: 3 ng/mL — ABNORMAL LOW (ref 11–307)

## 2019-01-28 LAB — FIBRIN DERIVATIVES D-DIMER (ARMC ONLY): Fibrin derivatives D-dimer (ARMC): 908.9 ng/mL (FEU) — ABNORMAL HIGH (ref 0.00–499.00)

## 2019-01-28 LAB — SARS CORONAVIRUS 2 BY RT PCR (HOSPITAL ORDER, PERFORMED IN ~~LOC~~ HOSPITAL LAB): SARS Coronavirus 2: NEGATIVE

## 2019-01-28 LAB — LACTATE DEHYDROGENASE: LDH: 142 U/L (ref 98–192)

## 2019-01-28 LAB — INFLUENZA PANEL BY PCR (TYPE A & B)
Influenza A By PCR: NEGATIVE
Influenza B By PCR: NEGATIVE

## 2019-01-28 LAB — PROCALCITONIN: Procalcitonin: <0.1 ng/mL

## 2019-01-28 MED ORDER — DOXYCYCLINE HYCLATE 100 MG PO TABS
100.0000 mg | ORAL_TABLET | Freq: Once | ORAL | Status: AC
  Administered 2019-01-28: 100 mg via ORAL
  Filled 2019-01-28: qty 1

## 2019-01-28 MED ORDER — DOXYCYCLINE HYCLATE 100 MG PO CAPS: 100.0000 mg | ORAL_CAPSULE | Freq: Two times a day (BID) | ORAL | 0 refills | Status: AC

## 2019-01-28 MED ORDER — NICOTINE 21 MG/24HR TD PT24
21.0000 mg | MEDICATED_PATCH | Freq: Once | TRANSDERMAL | Status: DC
  Administered 2019-01-28: 01:00:00 21 mg via TRANSDERMAL
  Filled 2019-01-28: qty 1

## 2019-01-28 MED ORDER — ACETAMINOPHEN 160 MG/5ML PO SUSP
ORAL | Status: AC
  Filled 2019-01-28: qty 35

## 2019-01-28 MED ORDER — ACETAMINOPHEN 160 MG/5ML PO SOLN
1000.0000 mg | Freq: Once | ORAL | Status: AC
  Administered 2019-01-28: 1000 mg via ORAL

## 2019-01-28 NOTE — Discharge Instructions (Signed)
We believe that your symptoms are caused today by pneumonia, an infection in your lung(s).  Fortunately you should start to improve quickly after taking your antibiotics.  Please take the full course of antibiotics as prescribed and drink plenty of fluids.  As we discussed, your COVID-19 test was negative today, but we still recommend that you read through the included information and isolate yourself from other people including her family as much as possible at least until you are feeling better.  Follow up with your doctor within 1-2 days.  If you develop any new or worsening symptoms, including but not limited to fever in spite of taking over-the-counter ibuprofen and/or Tylenol, persistent vomiting, worsening shortness of breath, or other symptoms that concern you, please return to the Emergency Department immediately.

## 2019-01-28 NOTE — ED Provider Notes (Addendum)
[facility] Emergency Department Provider Note  ____________________________________________   First MD Initiated Contact with Patient 01/27/19 2310     (approximate)  I have reviewed the triage vital signs and the nursing notes.   HISTORY  Chief Complaint Chills and Headache    HPI Lacey May is a 48 y.o. female with medical history as listed below with medical history as listed below who presents for evaluation of a variety of complaints.  She says that she had acute onset within the last couple of days of fever and chills, sore throat, headache, chest discomfort including pain when she takes deep breaths, mild cough, and generalized body aches all throughout her body.  Her symptoms have become worse over the last few days and are now severe.  Nothing in particular makes it better or worse.  She has been isolated at home but her adult son has been visiting and he is continuing to work at a [employer] so he has contact with the public but she has no known contact with anyone with COVID-19.  She has not had any nausea or vomiting but she has had decreased appetite.  She says that she feels terrible.  She has multiple sclerosis and sees a neurologist at [facility] but is not currently on steroids and has not been on steroids for about 2 years.         Past Medical History:  Diagnosis Date  . Diabetes mellitus without complication ([place])   . Esophageal cancer ([place])   . Fibromyalgia   . Hypertension   . MS (multiple sclerosis) ([place])   . Thyroid disease     Patient Active Problem List   Diagnosis Date Noted  . LLQ pain 02/11/2018  . Abnormal uterine bleeding 02/11/2018  . Vaginal atrophy 02/11/2018  . Galactorrhea 02/11/2018  . Palpitations 02/02/2015    Past Surgical History:  Procedure Laterality Date  . ABDOMINAL HYSTERECTOMY    . CESAREAN SECTION      Prior to Admission medications   Medication Sig Start Date End Date Taking? Authorizing  Provider  albuterol (PROVENTIL HFA;VENTOLIN HFA) 108 (90 BASE) MCG/ACT inhaler Frequency:PHARMDIR   Dosage:90   MCG  Instructions:  Note:2 puff q4-6 hrs prn shortness of breath or wheezingm use with spacer. Dose: 90MCG 06/18/11   [provider]  beclomethasone (QVAR) 80 MCG/ACT inhaler Inhale into the lungs. 01/11/15 01/11/16  [provider]  cetirizine (ZYRTEC) 10 MG tablet Take 10 mg by mouth. 12/11/11   [provider]  clonazePAM (KLONOPIN) 0.5 MG tablet Take 0.5 mg by mouth. 09/02/13   [provider]  clonazePAM (KLONOPIN) 1 MG disintegrating tablet Take by mouth.    [provider]  cyclobenzaprine (FLEXERIL) 10 MG tablet Take 1 tablet (10 mg total) by mouth 3 (three) times daily as needed (for esophageal spasm). Patient not taking: Reported on 02/11/2018 03/25/15   Schaevitz, Randall An, MD  diltiazem (CARDIZEM CD) 120 MG 24 hr capsule Take 120 mg by mouth. 12/15/13   [provider]  doxycycline (ADOXA) 100 MG tablet Take 100 mg by mouth 2 (two) times daily.    [provider]  doxycycline (VIBRAMYCIN) 100 MG capsule Take 1 capsule (100 mg total) by mouth 2 (two) times daily for 10 days. 01/28/19 02/07/19  Hinda Kehr, MD  DULoxetine (CYMBALTA) 20 MG capsule Take by mouth. 08/30/14   [provider]  DULoxetine (CYMBALTA) 60 MG capsule Take 60 mg by mouth. 03/03/13   [provider]  esomeprazole (NEXIUM) 40 MG capsule Take 40 mg by mouth. 03/03/13   [provider]  Fluticasone-Salmeterol (ADVAIR) 100-50 MCG/DOSE AEPB Frequency:BID   Dosage:0.0     Instructions:  Note:Dose: 1 06/18/11   [provider]  gabapentin (NEURONTIN) 300 MG capsule Take 900 mg by mouth 3 (three) times daily. TAKE 2 CAPSULES BY MOUTH THREE TIMES DAILY 11/17/14   [provider]  HYDROcodone-acetaminophen (NORCO/VICODIN) 5-325 MG per tablet Take 1 tablet by mouth every 6 (six) hours as needed for moderate pain.     [provider]  hyoscyamine (LEVSIN, ANASPAZ) 0.125 MG tablet Frequency:TID   Dosage:0.125   MG  Instructions:  Note:Dose: 0.125MG  09/02/13   [provider]  ipratropium (ATROVENT HFA) 17 MCG/ACT inhaler Frequency:PHARMDIR   Dosage:0.0     Instructions:  Note:2 puffs q 6 hours Dose: 17MCG/SPRAY 06/18/11   [provider]  Ipratropium-Albuterol (COMBIVENT) 20-100 MCG/ACT AERS respimat Inhale 1 puff into the lungs every 6 (six) hours.    [provider]  ketorolac (TORADOL) 10 MG tablet Take 1 tablet (10 mg total) by mouth every 8 (eight) hours as needed. 10/31/18   Norval Gable, MD  montelukast (SINGULAIR) 10 MG tablet Take 10 mg by mouth. 01/21/12   [provider]  mupirocin ointment (BACTROBAN) 2 % Apply 1 application topically 3 (three) times daily. To scalp sores Patient not taking: Reported on 02/11/2018 05/14/15   Wynona Luna, MD  nitroGLYCERIN (NITRODUR - DOSED IN MG/24 HR) 0.2 mg/hr patch Place 1 patch (0.2 mg total) onto the skin daily. Patient not taking: Reported on 02/11/2018 03/25/15   Orbie Pyo, MD  orphenadrine (NORFLEX) 100 MG tablet Take 1 tablet (100 mg total) by mouth 2 (two) times daily. Did not take with Flexeril Patient not taking: Reported on 02/11/2018 12/20/15   Frederich Cha, MD  oxyCODONE-acetaminophen (ROXICET) 5-325 MG tablet Take 1 tablet by mouth every 8 (eight) hours as needed for moderate pain or severe pain (Do not drive or operate heavy machinery while taking as can cause drowsiness.). Patient not taking: Reported on 02/11/2018 12/23/15   Marylene Land, NP  pregabalin (LYRICA) 150 MG capsule Take 150 mg by mouth. 03/25/13   [provider]  promethazine (PHENERGAN) 25 MG tablet Take 25 mg by mouth. 12/11/11   [provider]  traZODone (DESYREL) 50 MG tablet Take 50 mg by mouth. 03/03/13   [provider]  triamcinolone (KENALOG) 0.1 % paste Use as directed 1 application in the mouth  or throat 3 (three) times daily. To sore in left upper jaw until healed Patient not taking: Reported on 02/11/2018 05/14/15   Wynona Luna, MD    Allergies Penicillins; Nefazodone; and Benzoyl peroxide  Family History  Adopted: Yes  Problem Relation Age of Onset  . Cancer Mother   . Heart failure Father   . Hypertension Father   . Lupus Brother     Social History Social History   Tobacco Use  . Smoking status: Current Every Day Smoker    Packs/day: 0.50    Types: Cigarettes  . Smokeless tobacco: Never Used  Substance Use Topics  . Alcohol use: No    Alcohol/week: 0.0 standard drinks  . Drug use: No    Review of Systems Constitutional: +fever/chills Eyes: No visual changes. ENT: +sore throat.  Positive for nasal congestion and runny nose Cardiovascular: Generalized chest discomfort. Respiratory: Occasional shortness of breath and cough. Gastrointestinal: No abdominal pain.  Decreased appetite.  No nausea, no vomiting.  No diarrhea.  No constipation. Genitourinary: Negative for dysuria. Musculoskeletal: Muscle aches everywhere.   Integumentary: Negative for rash. Neurological: No focal numbness nor weakness.  Persistent headache.  ____________________________________________   PHYSICAL EXAM:  VITAL SIGNS: ED Triage Vitals  Enc Vitals Group     BP 01/27/19 2217 (!) 175/87     Pulse Rate 01/27/19 2217 85     Resp 01/27/19 2217 20     Temp 01/27/19 2217 98.6 F (37 C)     Temp Source 01/27/19 2217 Oral     SpO2 01/27/19 2217 99 %     Weight 01/27/19 2216 47.2 kg (104 lb)     Height 01/27/19 2216 1.6 m (5\' 3" )     Head Circumference --      Peak Flow --      Pain Score 01/27/19 2216 8     Pain Loc --      Pain Edu? --      Excl. in [place]? --     Constitutional: Alert and oriented.  Nontoxic appearance but does appear very uncomfortable. Eyes: Conjunctivae are normal.  Head: Atraumatic. Nose: No congestion/rhinnorhea. Mouth/Throat: Mucous membranes are  moist.  Oropharynx non-erythematous.  No exudate in the back of her throat and no petechiae. Neck: No stridor.  No meningeal signs.   Cardiovascular: Normal rate, regular rhythm. Good peripheral circulation. Grossly normal heart sounds. Respiratory: Normal respiratory effort.  No retractions. No audible wheezing. Gastrointestinal: Soft and nontender. No distention.  Musculoskeletal: No lower extremity tenderness nor edema. No gross deformities of extremities. Neurologic:  Normal speech and language. No gross focal neurologic deficits are appreciated.  Skin:  Skin is warm, dry and intact. No rash noted. Psychiatric: Mood and affect are normal. Speech and behavior are normal.  ____________________________________________   LABS (all labs ordered are listed, but only abnormal results are displayed)  Labs Reviewed  LACTIC ACID, PLASMA - Abnormal; Notable for the following components:      Result Value   Lactic Acid, Venous 0.4 (*)    All other components within normal limits  CBC WITH DIFFERENTIAL/PLATELET - Abnormal; Notable for the following components:   RBC 3.81 (*)    Hemoglobin 8.4 (*)    HCT 28.7 (*)    MCV 75.3 (*)    MCH 22.0 (*)    MCHC 29.3 (*)    RDW 19.8 (*)    All other components within normal limits  COMPREHENSIVE METABOLIC PANEL - Abnormal; Notable for the following components:   Calcium 8.8 (*)    Albumin 3.4 (*)    All other components within normal limits  FIBRIN DERIVATIVES D-DIMER ([facility] ONLY) - Abnormal; Notable for the following components:   Fibrin derivatives D-dimer ([facility]) 908.90 (*)    All other components within normal limits  FERRITIN - Abnormal; Notable for the following components:   Ferritin 3 (*)    All other components within normal limits  SARS CORONAVIRUS 2 (HOSPITAL ORDER, [place] [facility])  CULTURE, BLOOD (ROUTINE X 2)  CULTURE, BLOOD (ROUTINE X 2)  LACTATE DEHYDROGENASE  TRIGLYCERIDES  FIBRINOGEN  INFLUENZA PANEL BY  PCR (TYPE A & B)  LACTIC ACID, PLASMA  PROCALCITONIN  C-REACTIVE PROTEIN  POC URINE PREG, ED   ____________________________________________  EKG  ED ECG REPORT I, Hinda Kehr, the attending physician, personally viewed and interpreted this ECG.  Date: 01/28/2019 EKG Time: 00: 36 Rate: 69 Rhythm: normal sinus rhythm QRS Axis: normal Intervals: normal  ST/T Wave abnormalities: normal Narrative Interpretation: no evidence of acute ischemia  ____________________________________________  RADIOLOGY I, Hinda Kehr, personally viewed and evaluated these images (plain radiographs) as part of my medical decision making, as well as reviewing the written report by the radiologist.  ED MD interpretation: Probable right lower lobe pneumonia  Official radiology report(s): Dg Chest Port 1 View  Result Date: 01/28/2019 CLINICAL DATA:  Headache, chills, cough, shortness of breath EXAM: PORTABLE CHEST 1 VIEW COMPARISON:  12/20/2015 FINDINGS: Airspace opacity at the right lung base concerning for pneumonia. Left lung clear. Heart is normal size. No effusions or acute bony abnormality. IMPRESSION: Right lower lobe airspace disease concerning for pneumonia. Followup PA and lateral chest X-ray is recommended in 3-4 weeks following trial of antibiotic therapy to ensure resolution and exclude underlying malignancy. Electronically Signed   By: Rolm Baptise M.D.   On: 01/28/2019 00:27    ____________________________________________   PROCEDURES   Procedure(s) performed (including Critical Care):  Procedures   ____________________________________________   INITIAL IMPRESSION / MDM / [place] / ED COURSE  As part of my medical decision making, I reviewed the following data within the [facility] notes reviewed and incorporated, Labs reviewed , EKG interpreted , Old chart reviewed, Radiograph reviewed  and Notes from prior ED visits      Lacey May was  evaluated in Emergency Department on 01/28/2019 for the symptoms described in the history of present illness. She was evaluated in the context of the global COVID-19 pandemic, which necessitated consideration that the patient might be at risk for infection with the SARS-CoV-2 virus that causes COVID-19. Institutional protocols and algorithms that pertain to the evaluation of patients at risk for COVID-19 are in a state of rapid change based on information released by regulatory bodies including the CDC and federal and state organizations. These policies and algorithms were followed during the patient's care in the ED.  Differential diagnosis includes, but is not limited to, nonspecific viral illness, influenza, COVID-19, pneumonia, less likely ACS or PE.  The patient has no GI symptoms but has signs and symptoms consistent with respiratory virus.  She has only a relatively low risk of COVID-19 infection but her symptoms are consistent.  She states that she has a 27-year-old at home who has respiratory issues including asthma so given the patient's symptoms and the concern for family exposure I will tested for novel coronavirus tonight as well as doing the "preadmission" order set work-up to attempt to re-stratify the patient.  Chest x-ray is pending.  EKG is normal.  Her vital signs are all stable and within normal limits which is reassuring.  I explained to her that if there is no clear indication for admission she will likely be discharged home with plans for self isolation and she understands and agrees with the plan.  I am giving her Tylenol 1000 mg by mouth now and a small fluid bolus given that she said that she has had a headache has not been eating or drinking well which I suspect is likely due to mild dehydration.  I will reassess once all of the lab data is back.  Clinical Course as of Jan 27 300  Wed Jan 28, 2019  0121 WBC: 8.0 [CF]  0121 No recent priors against which to compare, although Hb in  2017 was 12.  Hemoglobin(!): 8.4 [CF]  0201 Negative COVID-19 based on nasopharyngeal swab.  SARS Coronavirus 2: NEGATIVE [CF]  0201 Elevated d-dimer, likely  secondary to infectious process.  Patient has PNA on CXR, and is PERC negative.  No indication for CTA chest at this time.  Fibrin derivatives D-dimer [facility])(!): 908.90 [CF]  0204 Negative influenza panel  Influenza panel by PCR (type A & B) [CF]  0255 The patient's work-up has been reassuring.  Her vital signs have remained stable entire time.  Her ferritin level is little bit on the low side but her comprehensive metabolic panel, CBC (other than hemoglobin), triglycerides, LDH, fibrinogen are all within normal limits.  As I stated previously I believe that her elevated d-dimer is the result of pneumonia.  She is looking better than she did before after the IV fluids and she is tolerating oral intake.  I gave her the results of her tests including the coronavirus although I did tell her that sometimes a negative test does not necessarily mean that the COVID-19 is negative and I encouraged her to continue to isolate herself as much as possible.  I will treat her as an outpatient with doxycycline 100 mg twice a day for 10 days and I gave strict return precautions and she understands and agrees with plan.   [CF]  0301 Of note, patient does NOT meet sepsis criteria   [CF]    Clinical Course User Index [CF] Hinda Kehr, MD     ____________________________________________  FINAL CLINICAL IMPRESSION(S) / ED DIAGNOSES  Final diagnoses:  Community acquired pneumonia of right lower lobe of lung ([place])     MEDICATIONS GIVEN DURING THIS VISIT:  Medications  nicotine (NICODERM CQ - dosed in mg/24 hours) patch 21 mg (21 mg Transdermal Patch Applied 01/28/19 0107)  doxycycline (VIBRA-TABS) tablet 100 mg (has no administration in time range)  sodium chloride 0.9 % bolus 500 mL (500 mLs Intravenous New Bag/Given 01/28/19 0108)  acetaminophen  (TYLENOL) solution 1,000 mg (1,000 mg Oral Given 01/28/19 0104)     ED Discharge Orders         Ordered    doxycycline (VIBRAMYCIN) 100 MG capsule  2 times daily     01/28/19 0258           Note:  This document was prepared using Dragon voice recognition software and may include unintentional dictation errors.   Hinda Kehr, MD 01/28/19 2683    Hinda Kehr, MD 01/28/19 4196

## 2019-02-02 LAB — CULTURE, BLOOD (ROUTINE X 2)
Culture: NO GROWTH
Culture: NO GROWTH

## 2019-03-16 ENCOUNTER — Encounter: Payer: Self-pay | Admitting: Emergency Medicine

## 2019-03-16 ENCOUNTER — Emergency Department
Admission: EM | Admit: 2019-03-16 | Discharge: 2019-03-17 | Disposition: A | Discharge status: Home / Self Care | Payer: Medicaid Other | Attending: Emergency Medicine | Admitting: Emergency Medicine

## 2019-03-16 ENCOUNTER — Emergency Department: Payer: Medicaid Other

## 2019-03-16 ENCOUNTER — Other Ambulatory Visit: Payer: Self-pay

## 2019-03-16 DIAGNOSIS — R531 Weakness: Secondary | ICD-10-CM | POA: Diagnosis present

## 2019-03-16 DIAGNOSIS — I10 Essential (primary) hypertension: Secondary | ICD-10-CM | POA: Insufficient documentation

## 2019-03-16 DIAGNOSIS — R5381 Other malaise: Secondary | ICD-10-CM

## 2019-03-16 DIAGNOSIS — F17210 Nicotine dependence, cigarettes, uncomplicated: Secondary | ICD-10-CM | POA: Diagnosis not present

## 2019-03-16 DIAGNOSIS — Z79899 Other long term (current) drug therapy: Secondary | ICD-10-CM | POA: Diagnosis not present

## 2019-03-16 DIAGNOSIS — D649 Anemia, unspecified: Secondary | ICD-10-CM | POA: Diagnosis not present

## 2019-03-16 DIAGNOSIS — Z20828 Contact with and (suspected) exposure to other viral communicable diseases: Secondary | ICD-10-CM | POA: Insufficient documentation

## 2019-03-16 DIAGNOSIS — E119 Type 2 diabetes mellitus without complications: Secondary | ICD-10-CM | POA: Insufficient documentation

## 2019-03-16 DIAGNOSIS — R0602 Shortness of breath: Secondary | ICD-10-CM | POA: Insufficient documentation

## 2019-03-16 LAB — CBC WITH DIFFERENTIAL/PLATELET
Abs Immature Granulocytes: 0.01 10*3/uL (ref 0.00–0.07)
Basophils Absolute: 0.1 10*3/uL (ref 0.0–0.1)
Basophils Relative: 1 %
Eosinophils Absolute: 0.2 10*3/uL (ref 0.0–0.5)
Eosinophils Relative: 3 %
HCT: 23.2 % — ABNORMAL LOW (ref 36.0–46.0)
Hemoglobin: 6.7 g/dL — ABNORMAL LOW (ref 12.0–15.0)
Immature Granulocytes: 0 %
Lymphocytes Relative: 30 %
Lymphs Abs: 1.7 10*3/uL (ref 0.7–4.0)
MCH: 21.5 pg — ABNORMAL LOW (ref 26.0–34.0)
MCHC: 28.9 g/dL — ABNORMAL LOW (ref 30.0–36.0)
MCV: 74.4 fL — ABNORMAL LOW (ref 80.0–100.0)
Monocytes Absolute: 0.7 10*3/uL (ref 0.1–1.0)
Monocytes Relative: 12 %
Neutro Abs: 3 10*3/uL (ref 1.7–7.7)
Neutrophils Relative %: 54 %
Platelets: 205 10*3/uL (ref 150–400)
RBC: 3.12 MIL/uL — ABNORMAL LOW (ref 3.87–5.11)
RDW: 19.9 % — ABNORMAL HIGH (ref 11.5–15.5)
WBC: 5.6 10*3/uL (ref 4.0–10.5)
nRBC: 0 % (ref 0.0–0.2)

## 2019-03-16 NOTE — ED Triage Notes (Addendum)
Patient ambulatory to triage with steady gait, without difficulty or distress noted; pt reports chest pressure, leg swelling, SHOB with exertion, nonprod cough; tx pneumonia several wks ago; +smoker

## 2019-03-17 LAB — URINALYSIS, ROUTINE W REFLEX MICROSCOPIC
Bilirubin Urine: NEGATIVE
Glucose, UA: NEGATIVE mg/dL
Hgb urine dipstick: NEGATIVE
Ketones, ur: NEGATIVE mg/dL
Nitrite: NEGATIVE
Protein, ur: NEGATIVE mg/dL
Specific Gravity, Urine: 1.002 — ABNORMAL LOW (ref 1.005–1.030)
pH: 6 (ref 5.0–8.0)

## 2019-03-17 LAB — COMPREHENSIVE METABOLIC PANEL
ALT: 30 U/L (ref 0–44)
AST: 45 U/L — ABNORMAL HIGH (ref 15–41)
Albumin: 2.8 g/dL — ABNORMAL LOW (ref 3.5–5.0)
Alkaline Phosphatase: 32 U/L — ABNORMAL LOW (ref 38–126)
Anion gap: 7 (ref 5–15)
BUN: 10 mg/dL (ref 6–20)
CO2: 26 mmol/L (ref 22–32)
Calcium: 8.7 mg/dL — ABNORMAL LOW (ref 8.9–10.3)
Chloride: 107 mmol/L (ref 98–111)
Creatinine, Ser: 0.76 mg/dL (ref 0.44–1.00)
GFR calc Af Amer: >60 mL/min (ref >60)
GFR calc non Af Amer: >60 mL/min (ref >60)
Glucose, Bld: 94 mg/dL (ref 70–99)
Potassium: 4 mmol/L (ref 3.5–5.1)
Sodium: 140 mmol/L (ref 135–145)
Total Bilirubin: 0.2 mg/dL — ABNORMAL LOW (ref 0.3–1.2)
Total Protein: 6.5 g/dL (ref 6.5–8.1)

## 2019-03-17 LAB — BRAIN NATRIURETIC PEPTIDE: B Natriuretic Peptide: 95 pg/mL (ref 0.0–100.0)

## 2019-03-17 LAB — TYPE AND SCREEN
ABO/RH(D): A POS
Antibody Screen: NEGATIVE

## 2019-03-17 LAB — TROPONIN I: Troponin I: <0.03 ng/mL (ref <0.03)

## 2019-03-17 LAB — LACTIC ACID, PLASMA: Lactic Acid, Venous: 0.6 mmol/L (ref 0.5–1.9)

## 2019-03-17 NOTE — ED Provider Notes (Signed)
[facility] Emergency Department Provider Note  ____________________________________________   First MD Initiated Contact with Patient 03/16/19 2321     (approximate)  I have reviewed the triage vital signs and the nursing notes.   HISTORY  Chief Complaint Chest Pain and Shortness of Breath    HPI Lacey May is a 48 y.o. female with medical history as listed below who presents for evaluation of at least a week of generalized weakness, fatigue, malaise, and shortness of breath with exertion.  I saw her about 6 weeks ago and diagnosed her with community-acquired pneumonia.  After some doxycycline and azithromycin prescribed by her outpatient doctor, Dr. Iona Beard, she is no longer having cough, but gradually over time the other symptoms as described above have been getting worse.  She denies fever/chills, shortness of breath except with exertion, cough, chest pain, nausea, vomiting, abdominal pain, and dysuria.  Sometimes she feels a heaviness in her chest particularly when she is exerting herself.  She feels so tired that she feels like it is hard to get up  and around.  She has not been having any vaginal bleeding, black or tarry stools, blood in her stools, vomiting with or without hematemesis, or blood in her urine.  Nothing in particular makes her symptoms better.  She has not been around anyone known to have COVID-19 and she tested negative for COVID-19 6 weeks ago.        Past Medical History:  Diagnosis Date  . Diabetes mellitus without complication ([place])   . Esophageal cancer ([place])   . Fibromyalgia   . Hypertension   . MS (multiple sclerosis) ([place])   . Thyroid disease     Patient Active Problem List   Diagnosis Date Noted  . LLQ pain 02/11/2018  . Abnormal uterine bleeding 02/11/2018  . Vaginal atrophy 02/11/2018  . Galactorrhea 02/11/2018  . Palpitations 02/02/2015    Past Surgical History:  Procedure Laterality Date  . ABDOMINAL  HYSTERECTOMY    . CESAREAN SECTION      Prior to Admission medications   Medication Sig Start Date End Date Taking? Authorizing Provider  albuterol (PROVENTIL HFA;VENTOLIN HFA) 108 (90 BASE) MCG/ACT inhaler Frequency:PHARMDIR   Dosage:90   MCG  Instructions:  Note:2 puff q4-6 hrs prn shortness of breath or wheezingm use with spacer. Dose: 90MCG 06/18/11   [provider]  beclomethasone (QVAR) 80 MCG/ACT inhaler Inhale into the lungs. 01/11/15 01/11/16  [provider]  cetirizine (ZYRTEC) 10 MG tablet Take 10 mg by mouth. 12/11/11   [provider]  clonazePAM (KLONOPIN) 0.5 MG tablet Take 0.5 mg by mouth. 09/02/13   [provider]  clonazePAM (KLONOPIN) 1 MG disintegrating tablet Take by mouth.    [provider]  cyclobenzaprine (FLEXERIL) 10 MG tablet Take 1 tablet (10 mg total) by mouth 3 (three) times daily as needed (for esophageal spasm). Patient not taking: Reported on 02/11/2018 03/25/15   Schaevitz, Randall An, MD  diltiazem (CARDIZEM CD) 120 MG 24 hr capsule Take 120 mg by mouth. 12/15/13   [provider]  doxycycline (ADOXA) 100 MG tablet Take 100 mg by mouth 2 (two) times daily.    [provider]  DULoxetine (CYMBALTA) 20 MG capsule Take by mouth. 08/30/14   [provider]  DULoxetine (CYMBALTA) 60 MG capsule Take 60 mg by mouth. 03/03/13   [provider]  esomeprazole (NEXIUM) 40 MG capsule Take 40 mg by mouth. 03/03/13   [provider]  Fluticasone-Salmeterol (ADVAIR) 100-50 MCG/DOSE AEPB Frequency:BID   Dosage:0.0     Instructions:  Note:Dose: 1 06/18/11   [provider]  gabapentin (NEURONTIN) 300 MG capsule Take 900 mg by mouth 3 (three) times daily. TAKE 2 CAPSULES BY MOUTH THREE TIMES DAILY 11/17/14   [provider]  HYDROcodone-acetaminophen (NORCO/VICODIN) 5-325 MG per tablet Take 1 tablet by mouth every 6 (six) hours as needed for moderate pain.    [provider]  hyoscyamine (LEVSIN, ANASPAZ) 0.125 MG tablet Frequency:TID   Dosage:0.125   MG  Instructions:  Note:Dose: 0.125MG  09/02/13   [provider]  ipratropium (ATROVENT HFA) 17 MCG/ACT inhaler Frequency:PHARMDIR   Dosage:0.0     Instructions:  Note:2 puffs q 6 hours Dose: 17MCG/SPRAY 06/18/11   [provider]  Ipratropium-Albuterol (COMBIVENT) 20-100 MCG/ACT AERS respimat Inhale 1 puff into the lungs every 6 (six) hours.    [provider]  ketorolac (TORADOL) 10 MG tablet Take 1 tablet (10 mg total) by mouth every 8 (eight) hours as needed. 10/31/18   Norval Gable, MD  montelukast (SINGULAIR) 10 MG tablet Take 10 mg by mouth. 01/21/12   [provider]  mupirocin ointment (BACTROBAN) 2 % Apply 1 application topically 3 (three) times daily. To scalp sores Patient not taking: Reported on 02/11/2018 05/14/15   Wynona Luna, MD  nitroGLYCERIN (NITRODUR - DOSED IN MG/24 HR) 0.2 mg/hr patch Place 1 patch (0.2 mg total) onto the skin daily. Patient not taking: Reported on 02/11/2018 03/25/15   Orbie Pyo, MD  orphenadrine (NORFLEX) 100 MG tablet Take 1 tablet (100 mg total) by mouth 2 (two) times daily. Did not take with Flexeril Patient not taking: Reported on 02/11/2018 12/20/15   Frederich Cha, MD  oxyCODONE-acetaminophen (ROXICET) 5-325 MG tablet Take 1 tablet by mouth every 8 (eight) hours as needed for moderate pain or severe pain (Do not drive or operate heavy machinery while taking as can cause drowsiness.). Patient not taking: Reported on 02/11/2018 12/23/15   Marylene Land, NP  pregabalin (LYRICA) 150 MG capsule Take 150 mg by mouth. 03/25/13   [provider]  promethazine (PHENERGAN) 25 MG tablet Take 25 mg by mouth. 12/11/11   [provider]  traZODone (DESYREL) 50 MG tablet Take 50 mg by mouth. 03/03/13   [provider]  triamcinolone (KENALOG) 0.1 % paste Use as directed 1 application in the mouth or throat 3 (three)  times daily. To sore in left upper jaw until healed Patient not taking: Reported on 02/11/2018 05/14/15   Wynona Luna, MD    Allergies Penicillins; Nefazodone; and Benzoyl peroxide  Family History  Adopted: Yes  Problem Relation Age of Onset  . Cancer Mother   . Heart failure Father   . Hypertension Father   . Lupus Brother     Social History Social History   Tobacco Use  . Smoking status: Current Every Day Smoker    Packs/day: 0.50    Types: Cigarettes  . Smokeless tobacco: Never Used  Substance Use Topics  . Alcohol use: No    Alcohol/week: 0.0 standard drinks  . Drug use: No    Review of Systems Constitutional: No fever/chills.  General malaise and fatigue and generalized weakness. Eyes: No visual changes. ENT: No sore throat. Cardiovascular: Denies chest pain.  Occasional heaviness in her chest. Respiratory: Dyspnea on exertion. Gastrointestinal: No abdominal pain.  No nausea, no vomiting.  No diarrhea.  No constipation. Genitourinary: Negative for dysuria. Musculoskeletal: Negative for  neck pain.  Negative for back pain. Integumentary: Negative for rash. Neurological: Negative for headaches, focal weakness or numbness.   ____________________________________________   PHYSICAL EXAM:  VITAL SIGNS: ED Triage Vitals  Enc Vitals Group     BP 03/16/19 2309 134/66     Pulse Rate 03/16/19 2309 79     Resp 03/16/19 2309 18     Temp 03/16/19 2309 98.5 F (36.9 C)     Temp Source 03/16/19 2309 Oral     SpO2 03/16/19 2309 100 %     Weight 03/16/19 2312 47.2 kg (104 lb)     Height 03/16/19 2312 1.6 m (5\' 3" )     Head Circumference --      Peak Flow --      Pain Score 03/16/19 2312 8     Pain Loc --      Pain Edu? --      Excl. in [place]? --     Constitutional: Alert and oriented.  Appears to have a degree of chronic illness but is not in acute distress at this time.  Obvious long-term tobacco user. Eyes: Conjunctivae are normal.  Head: Atraumatic. Nose:  No congestion/rhinnorhea. Mouth/Throat: Mucous membranes are moist. Neck: No stridor.  No meningeal signs.   Cardiovascular: Normal rate, regular rhythm. Good peripheral circulation. Grossly normal heart sounds. Respiratory: Normal respiratory effort.  No retractions. No audible wheezing. Gastrointestinal: Soft and nontender. No distention.  Rectal exam is notable for light brown stool that is Hemoccult negative.  ED chaperone present throughout exam. Musculoskeletal: No lower extremity tenderness nor edema. No gross deformities of extremities. Neurologic:  Normal speech and language. No gross focal neurologic deficits are appreciated.  Skin:  Skin is warm, dry and intact. No rash noted. Psychiatric: Mood and affect are normal. Speech and behavior are normal.  ____________________________________________   LABS (all labs ordered are listed, but only abnormal results are displayed)  Labs Reviewed  CBC WITH DIFFERENTIAL/PLATELET - Abnormal; Notable for the following components:      Result Value   RBC 3.12 (*)    Hemoglobin 6.7 (*)    HCT 23.2 (*)    MCV 74.4 (*)    MCH 21.5 (*)    MCHC 28.9 (*)    RDW 19.9 (*)    All other components within normal limits  COMPREHENSIVE METABOLIC PANEL - Abnormal; Notable for the following components:   Calcium 8.7 (*)    Albumin 2.8 (*)    AST 45 (*)    Alkaline Phosphatase 32 (*)    Total Bilirubin 0.2 (*)    All other components within normal limits  URINALYSIS, ROUTINE W REFLEX MICROSCOPIC - Abnormal; Notable for the following components:   Color, Urine STRAW (*)    APPearance CLEAR (*)    Specific Gravity, Urine 1.002 (*)    Leukocytes,Ua TRACE (*)    Bacteria, UA MANY (*)    All other components within normal limits  URINE CULTURE  NOVEL CORONAVIRUS, NAA (HOSPITAL ORDER, SEND-OUT TO REF LAB)  BRAIN NATRIURETIC PEPTIDE  TROPONIN I  LACTIC ACID, PLASMA  TYPE AND SCREEN   ____________________________________________  EKG  ED ECG  REPORT I, Hinda Kehr, the attending physician, personally viewed and interpreted this ECG.  Date: 03/16/2019 EKG Time: 23:13 Rate: 72 Rhythm: normal sinus rhythm QRS Axis: normal Intervals: normal ST/T Wave abnormalities: normal Narrative Interpretation: no evidence of acute ischemia  ____________________________________________  RADIOLOGY   ED MD interpretation: Opacity previously seen on chest x-ray has resolved,  no evidence of acute abnormality.  Official radiology report(s): Dg Chest Portable 1 View  Result Date: 03/17/2019 CLINICAL DATA:  48 year old female with chest pressure, lower extremity swelling, shortness of breath. Smoker. EXAM: PORTABLE CHEST 1 VIEW COMPARISON:  01/28/2019 portable chest and earlier. FINDINGS: Portable AP upright view at 2334 hours. Stable lung volumes. Resolved patchy opacity at the right lung base since April. Stable cardiac size at the upper limits of normal. Other mediastinal contours are within normal limits. Mild bilateral interstitial markings are stable with no pneumothorax, pulmonary edema, pleural effusion or acute pulmonary opacity. Negative visible bowel gas pattern. No acute osseous abnormality identified. IMPRESSION: No acute cardiopulmonary abnormality. Electronically Signed   By: Genevie Ann M.D.   On: 03/17/2019 00:04    ____________________________________________   PROCEDURES   Procedure(s) performed (including Critical Care):  Procedures   ____________________________________________   INITIAL IMPRESSION / MDM / [place] / ED COURSE  As part of my medical decision making, I reviewed the following data within the [facility] notes reviewed and incorporated, Labs reviewed , EKG interpreted , Old EKG reviewed, Old chart reviewed, Radiograph reviewed  and Notes from prior ED visits      *Lacey May was evaluated in Emergency Department on 03/17/2019 for the symptoms described in the history  of present illness. She was evaluated in the context of the global COVID-19 pandemic, which necessitated consideration that the patient might be at risk for infection with the SARS-CoV-2 virus that causes COVID-19. Institutional protocols and algorithms that pertain to the evaluation of patients at risk for COVID-19 are in a state of rapid change based on information released by regulatory bodies including the CDC and federal and state organizations. These policies and algorithms were followed during the patient's care in the ED.  Some ED evaluations and interventions may be delayed as a result of limited staffing during the pandemic.*  Differential diagnosis includes, but is not limited to, viral infection, chronic illness, symptomatic anemia, persistent pneumonia, COVID-19.  The patient is having no infectious signs or symptoms right now.  Her chest x-ray is improved from prior and shows resolution of the community-acquired pneumonia.  Her metabolic panel is within normal limits, troponin is negative, and urinalysis is unremarkable.  She has no evidence of hematuria nor GI bleeding.  However her CBC is notable for microcytic anemia with a hemoglobin of 6.7.  I looked back through her records and she has dropped steadily since about 2017 when she had a hemoglobin of 12.  When I saw her 6 weeks ago her hemoglobin was down to about between 8 and 9 and now it is below 7.  I offered to admit her to the hospital for a blood transfusion and further hematological work-up, but she declined due to childcare issues.  I also offered to give her a unit of blood in the emergency department so she will feel better and defer the hematological work-up to the outpatient setting, but she also feels like she cannot stay long enough for the blood transfusion tonight.  Since this is been a gradually worsening issue for an extended period of time I think it is appropriate for her to follow-up as long she promises to do so.  I sent a  message through [facility] to her PCP, Dr. Iona Beard, as well as to Dr. Grayland Ormond and Dr. Janese Banks with the hematology center to try and facilitate her work-up.  I also sent a novel coronavirus swab in case  that is necessary before she can be seen in clinic and I sent a type and screen.  I did not start her on iron because I do not want to affect the outpatient hematology work-up.  She understands and agrees with the plan to follow-up and will return if she develops new or worsening symptoms.      ____________________________________________  FINAL CLINICAL IMPRESSION(S) / ED DIAGNOSES  Final diagnoses:  Symptomatic anemia  Generalized weakness  Malaise and fatigue     MEDICATIONS GIVEN DURING THIS VISIT:  Medications - No data to display   ED Discharge Orders    None       Note:  This document was prepared using Dragon voice recognition software and may include unintentional dictation errors.   Hinda Kehr, MD 03/17/19 [phone]

## 2019-03-17 NOTE — Discharge Instructions (Signed)
As we discussed, your work-up was generally reassuring except for your hemoglobin which is dropped profoundly over the last few years and even over the last couple of months.  Your hemoglobin tonight was 6.7.  I offered to admit you to the hospital for a blood transfusion and further blood (hematology) evaluation and work-up, and even offered to give you a blood transfusion in the emergency department and discharge you for further work-up as an outpatient, but due to your childcare obligations you felt you could not stay.  I sent a message to your primary care provider explaining the situation and also sent a message both to Dr. Janese Banks and to Dr. Grayland Ormond, both with the hematology team at [facility].  Please follow-up with 1 or more of these providers to arrange further outpatient work-up of your anemia as well as an outpatient blood transfusion.  Return to the emergency department if you develop new or worsening symptoms that concern you.

## 2019-03-18 ENCOUNTER — Other Ambulatory Visit: Payer: Self-pay

## 2019-03-18 ENCOUNTER — Inpatient Hospital Stay: Payer: Medicaid Other | Attending: Oncology | Admitting: Oncology

## 2019-03-18 ENCOUNTER — Encounter: Payer: Self-pay | Admitting: Oncology

## 2019-03-18 DIAGNOSIS — F17210 Nicotine dependence, cigarettes, uncomplicated: Secondary | ICD-10-CM

## 2019-03-18 DIAGNOSIS — I10 Essential (primary) hypertension: Secondary | ICD-10-CM

## 2019-03-18 DIAGNOSIS — Z79899 Other long term (current) drug therapy: Secondary | ICD-10-CM

## 2019-03-18 DIAGNOSIS — E119 Type 2 diabetes mellitus without complications: Secondary | ICD-10-CM

## 2019-03-18 DIAGNOSIS — D509 Iron deficiency anemia, unspecified: Secondary | ICD-10-CM

## 2019-03-18 LAB — NOVEL CORONAVIRUS, NAA (HOSP ORDER, SEND-OUT TO REF LAB; TAT 18-24 HRS): SARS-CoV-2, NAA: NOT DETECTED

## 2019-03-19 ENCOUNTER — Inpatient Hospital Stay: Payer: Medicaid Other

## 2019-03-19 ENCOUNTER — Other Ambulatory Visit: Payer: Self-pay

## 2019-03-19 VITALS — BP 100/61 | HR 81 | Temp 98.5°F | Resp 16

## 2019-03-19 DIAGNOSIS — D509 Iron deficiency anemia, unspecified: Secondary | ICD-10-CM

## 2019-03-19 MED ORDER — SODIUM CHLORIDE 0.9 % IV SOLN
Freq: Once | INTRAVENOUS | Status: AC
  Administered 2019-03-19: 11:00:00 via INTRAVENOUS
  Filled 2019-03-19: qty 250

## 2019-03-19 MED ORDER — SODIUM CHLORIDE 0.9 % IV SOLN
510.0000 mg | Freq: Once | INTRAVENOUS | Status: AC
  Administered 2019-03-19: 12:00:00 510 mg via INTRAVENOUS
  Filled 2019-03-19: qty 17

## 2019-03-20 ENCOUNTER — Inpatient Hospital Stay
Admission: EM | Admit: 2019-03-20 | Discharge: 2019-03-21 | DRG: 377 | Disposition: A | Discharge status: Home / Self Care | Payer: Medicaid Other | Attending: Internal Medicine | Admitting: Internal Medicine

## 2019-03-20 ENCOUNTER — Emergency Department: Payer: Medicaid Other

## 2019-03-20 ENCOUNTER — Inpatient Hospital Stay: Payer: Self-pay | Admitting: Oncology

## 2019-03-20 ENCOUNTER — Other Ambulatory Visit: Payer: Self-pay

## 2019-03-20 ENCOUNTER — Telehealth: Payer: Self-pay | Admitting: Oncology

## 2019-03-20 ENCOUNTER — Inpatient Hospital Stay: Payer: Medicaid Other | Admitting: Anesthesiology

## 2019-03-20 ENCOUNTER — Encounter
Admission: EM | Disposition: A | Discharge status: Home / Self Care | Payer: Self-pay | Source: Home / Self Care | Attending: Internal Medicine

## 2019-03-20 ENCOUNTER — Ambulatory Visit: Payer: Self-pay

## 2019-03-20 DIAGNOSIS — K920 Hematemesis: Secondary | ICD-10-CM | POA: Diagnosis present

## 2019-03-20 DIAGNOSIS — T39395A Adverse effect of other nonsteroidal anti-inflammatory drugs [NSAID], initial encounter: Secondary | ICD-10-CM | POA: Diagnosis present

## 2019-03-20 DIAGNOSIS — F17210 Nicotine dependence, cigarettes, uncomplicated: Secondary | ICD-10-CM | POA: Diagnosis present

## 2019-03-20 DIAGNOSIS — Z791 Long term (current) use of non-steroidal anti-inflammatories (NSAID): Secondary | ICD-10-CM

## 2019-03-20 DIAGNOSIS — K254 Chronic or unspecified gastric ulcer with hemorrhage: Secondary | ICD-10-CM | POA: Diagnosis present

## 2019-03-20 DIAGNOSIS — D62 Acute posthemorrhagic anemia: Secondary | ICD-10-CM | POA: Diagnosis present

## 2019-03-20 DIAGNOSIS — Z1159 Encounter for screening for other viral diseases: Secondary | ICD-10-CM | POA: Diagnosis not present

## 2019-03-20 DIAGNOSIS — G35 Multiple sclerosis: Secondary | ICD-10-CM | POA: Diagnosis present

## 2019-03-20 DIAGNOSIS — M797 Fibromyalgia: Secondary | ICD-10-CM | POA: Diagnosis present

## 2019-03-20 DIAGNOSIS — R0600 Dyspnea, unspecified: Secondary | ICD-10-CM | POA: Diagnosis not present

## 2019-03-20 DIAGNOSIS — Z90710 Acquired absence of both cervix and uterus: Secondary | ICD-10-CM | POA: Diagnosis not present

## 2019-03-20 DIAGNOSIS — J449 Chronic obstructive pulmonary disease, unspecified: Secondary | ICD-10-CM | POA: Diagnosis present

## 2019-03-20 DIAGNOSIS — K219 Gastro-esophageal reflux disease without esophagitis: Secondary | ICD-10-CM | POA: Diagnosis present

## 2019-03-20 DIAGNOSIS — R578 Other shock: Secondary | ICD-10-CM | POA: Diagnosis present

## 2019-03-20 DIAGNOSIS — K259 Gastric ulcer, unspecified as acute or chronic, without hemorrhage or perforation: Secondary | ICD-10-CM

## 2019-03-20 DIAGNOSIS — E119 Type 2 diabetes mellitus without complications: Secondary | ICD-10-CM | POA: Diagnosis present

## 2019-03-20 DIAGNOSIS — Z79899 Other long term (current) drug therapy: Secondary | ICD-10-CM | POA: Diagnosis not present

## 2019-03-20 DIAGNOSIS — Z833 Family history of diabetes mellitus: Secondary | ICD-10-CM

## 2019-03-20 DIAGNOSIS — E039 Hypothyroidism, unspecified: Secondary | ICD-10-CM | POA: Diagnosis present

## 2019-03-20 DIAGNOSIS — D509 Iron deficiency anemia, unspecified: Secondary | ICD-10-CM | POA: Diagnosis present

## 2019-03-20 DIAGNOSIS — I10 Essential (primary) hypertension: Secondary | ICD-10-CM | POA: Diagnosis present

## 2019-03-20 HISTORY — PX: ESOPHAGOGASTRODUODENOSCOPY (EGD) WITH PROPOFOL: SHX5813

## 2019-03-20 LAB — COMPREHENSIVE METABOLIC PANEL
ALT: 15 U/L (ref 0–44)
AST: 23 U/L (ref 15–41)
Albumin: 2.4 g/dL — ABNORMAL LOW (ref 3.5–5.0)
Alkaline Phosphatase: 25 U/L — ABNORMAL LOW (ref 38–126)
Anion gap: 7 (ref 5–15)
BUN: 29 mg/dL — ABNORMAL HIGH (ref 6–20)
CO2: 23 mmol/L (ref 22–32)
Calcium: 7.7 mg/dL — ABNORMAL LOW (ref 8.9–10.3)
Chloride: 109 mmol/L (ref 98–111)
Creatinine, Ser: 1.05 mg/dL — ABNORMAL HIGH (ref 0.44–1.00)
GFR calc Af Amer: >60 mL/min (ref >60)
GFR calc non Af Amer: >60 mL/min (ref >60)
Glucose, Bld: 133 mg/dL — ABNORMAL HIGH (ref 70–99)
Potassium: 3.5 mmol/L (ref 3.5–5.1)
Sodium: 139 mmol/L (ref 135–145)
Total Bilirubin: 0.3 mg/dL (ref 0.3–1.2)
Total Protein: 5.2 g/dL — ABNORMAL LOW (ref 6.5–8.1)

## 2019-03-20 LAB — CBC WITH DIFFERENTIAL/PLATELET
Abs Immature Granulocytes: 0.06 10*3/uL (ref 0.00–0.07)
Basophils Absolute: 0.1 10*3/uL (ref 0.0–0.1)
Basophils Relative: 1 %
Eosinophils Absolute: 0.1 10*3/uL (ref 0.0–0.5)
Eosinophils Relative: 1 %
HCT: 14 % — CL (ref 36.0–46.0)
Hemoglobin: 4 g/dL — CL (ref 12.0–15.0)
Immature Granulocytes: 1 %
Lymphocytes Relative: 18 %
Lymphs Abs: 1.9 10*3/uL (ref 0.7–4.0)
MCH: 21.4 pg — ABNORMAL LOW (ref 26.0–34.0)
MCHC: 28.6 g/dL — ABNORMAL LOW (ref 30.0–36.0)
MCV: 74.9 fL — ABNORMAL LOW (ref 80.0–100.0)
Monocytes Absolute: 0.7 10*3/uL (ref 0.1–1.0)
Monocytes Relative: 6 %
Neutro Abs: 7.8 10*3/uL — ABNORMAL HIGH (ref 1.7–7.7)
Neutrophils Relative %: 73 %
Platelets: 194 10*3/uL (ref 150–400)
RBC: 1.87 MIL/uL — ABNORMAL LOW (ref 3.87–5.11)
RDW: 20.1 % — ABNORMAL HIGH (ref 11.5–15.5)
WBC: 10.6 10*3/uL — ABNORMAL HIGH (ref 4.0–10.5)
nRBC: 0 % (ref 0.0–0.2)

## 2019-03-20 LAB — HEMOGLOBIN AND HEMATOCRIT, BLOOD
HCT: 20.9 % — ABNORMAL LOW (ref 36.0–46.0)
HCT: 23.3 % — ABNORMAL LOW (ref 36.0–46.0)
HCT: 25.3 % — ABNORMAL LOW (ref 36.0–46.0)
HCT: 26.4 % — ABNORMAL LOW (ref 36.0–46.0)
Hemoglobin: 6.8 g/dL — ABNORMAL LOW (ref 12.0–15.0)
Hemoglobin: 7.3 g/dL — ABNORMAL LOW (ref 12.0–15.0)
Hemoglobin: 8.5 g/dL — ABNORMAL LOW (ref 12.0–15.0)
Hemoglobin: 8.7 g/dL — ABNORMAL LOW (ref 12.0–15.0)

## 2019-03-20 LAB — VITAMIN B12: Vitamin B-12: 227 pg/mL (ref 180–914)

## 2019-03-20 LAB — PREPARE RBC (CROSSMATCH)

## 2019-03-20 LAB — LIPASE, BLOOD: Lipase: 26 U/L (ref 11–51)

## 2019-03-20 LAB — GLUCOSE, CAPILLARY: Glucose-Capillary: 103 mg/dL — ABNORMAL HIGH (ref 70–99)

## 2019-03-20 LAB — IRON AND TIBC
Iron: 388 ug/dL — ABNORMAL HIGH (ref 28–170)
Saturation Ratios: 137 % — ABNORMAL HIGH (ref 10.4–31.8)
TIBC: 283 ug/dL (ref 250–450)
UIBC: 105 ug/dL

## 2019-03-20 LAB — PROTIME-INR
INR: 1.2 (ref 0.8–1.2)
Prothrombin Time: 15.2 seconds (ref 11.4–15.2)

## 2019-03-20 LAB — URINE CULTURE
Culture: >=100000 — AB
Special Requests: NORMAL

## 2019-03-20 LAB — TSH: TSH: 1.644 u[IU]/mL (ref 0.350–4.500)

## 2019-03-20 LAB — MRSA PCR SCREENING: MRSA by PCR: NEGATIVE

## 2019-03-20 LAB — FERRITIN: Ferritin: 18 ng/mL (ref 11–307)

## 2019-03-20 SURGERY — ESOPHAGOGASTRODUODENOSCOPY (EGD) WITH PROPOFOL
Anesthesia: General

## 2019-03-20 MED ORDER — NICOTINE 21 MG/24HR TD PT24
21.0000 mg | MEDICATED_PATCH | Freq: Every day | TRANSDERMAL | Status: DC
  Administered 2019-03-20 – 2019-03-21 (×2): 21 mg via TRANSDERMAL
  Filled 2019-03-20 (×2): qty 1

## 2019-03-20 MED ORDER — SODIUM CHLORIDE 0.9 % IV SOLN
10.0000 mL/h | Freq: Once | INTRAVENOUS | Status: AC
  Administered 2019-03-20: 10 mL/h via INTRAVENOUS

## 2019-03-20 MED ORDER — TRAZODONE HCL 50 MG PO TABS
50.0000 mg | ORAL_TABLET | Freq: Every day | ORAL | Status: DC
  Administered 2019-03-20: 25 mg via ORAL
  Filled 2019-03-20: qty 1

## 2019-03-20 MED ORDER — OCTREOTIDE ACETATE 100 MCG/ML IJ SOLN
50.0000 ug | Freq: Once | INTRAMUSCULAR | Status: AC
  Administered 2019-03-20: 50 ug via INTRAVENOUS
  Filled 2019-03-20: qty 0.5

## 2019-03-20 MED ORDER — ALBUTEROL SULFATE (2.5 MG/3ML) 0.083% IN NEBU: 2.5000 mg | INHALATION_SOLUTION | RESPIRATORY_TRACT | Status: DC | PRN

## 2019-03-20 MED ORDER — SODIUM CHLORIDE 0.9 % IV SOLN
8.0000 mg/h | INTRAVENOUS | Status: DC
  Administered 2019-03-20 – 2019-03-21 (×2): 8 mg/h via INTRAVENOUS
  Filled 2019-03-20 (×3): qty 80

## 2019-03-20 MED ORDER — IPRATROPIUM-ALBUTEROL 0.5-2.5 (3) MG/3ML IN SOLN: 3.0000 mL | Freq: Four times a day (QID) | RESPIRATORY_TRACT | Status: DC | PRN

## 2019-03-20 MED ORDER — SODIUM CHLORIDE 0.9 % IV SOLN
INTRAVENOUS | Status: DC
  Administered 2019-03-20 (×2): via INTRAVENOUS

## 2019-03-20 MED ORDER — SODIUM CHLORIDE 0.9 % IV SOLN
80.0000 mg | Freq: Once | INTRAVENOUS | Status: AC
  Administered 2019-03-20: 80 mg via INTRAVENOUS
  Filled 2019-03-20: qty 80

## 2019-03-20 MED ORDER — FUROSEMIDE 10 MG/ML IJ SOLN
20.0000 mg | Freq: Once | INTRAMUSCULAR | Status: AC
  Administered 2019-03-20: 16:00:00 20 mg via INTRAVENOUS
  Filled 2019-03-20: qty 2

## 2019-03-20 MED ORDER — ONDANSETRON HCL 4 MG/2ML IJ SOLN: 4.0000 mg | Freq: Four times a day (QID) | INTRAMUSCULAR | Status: DC | PRN

## 2019-03-20 MED ORDER — SODIUM CHLORIDE 0.9 % IV SOLN
50.0000 ug/h | INTRAVENOUS | Status: DC
  Administered 2019-03-20: 50 ug/h via INTRAVENOUS
  Filled 2019-03-20 (×3): qty 1

## 2019-03-20 MED ORDER — SODIUM CHLORIDE 0.9 % IV SOLN
INTRAVENOUS | Status: DC
  Administered 2019-03-20: 05:00:00 via INTRAVENOUS

## 2019-03-20 MED ORDER — IPRATROPIUM-ALBUTEROL 20-100 MCG/ACT IN AERS: 1.0000 | INHALATION_SPRAY | Freq: Four times a day (QID) | RESPIRATORY_TRACT | Status: DC | PRN

## 2019-03-20 MED ORDER — SODIUM CHLORIDE 0.9 % IV SOLN
1.0000 g | INTRAVENOUS | Status: DC
  Administered 2019-03-20: 1 g via INTRAVENOUS
  Filled 2019-03-20 (×2): qty 10

## 2019-03-20 MED ORDER — ACETAMINOPHEN 650 MG RE SUPP: 650.0000 mg | Freq: Four times a day (QID) | RECTAL | Status: DC | PRN

## 2019-03-20 MED ORDER — ONDANSETRON HCL 4 MG/2ML IJ SOLN
4.0000 mg | Freq: Once | INTRAMUSCULAR | Status: AC
  Administered 2019-03-20: 04:00:00 4 mg via INTRAVENOUS

## 2019-03-20 MED ORDER — PROPOFOL 10 MG/ML IV BOLUS
INTRAVENOUS | Status: DC | PRN
  Administered 2019-03-20: 70 mg via INTRAVENOUS

## 2019-03-20 MED ORDER — ACETAMINOPHEN 325 MG PO TABS
650.0000 mg | ORAL_TABLET | Freq: Once | ORAL | Status: AC
  Administered 2019-03-20: 650 mg via ORAL
  Filled 2019-03-20: qty 2

## 2019-03-20 MED ORDER — ACETAMINOPHEN 325 MG PO TABS: 650.0000 mg | ORAL_TABLET | Freq: Four times a day (QID) | ORAL | Status: DC | PRN

## 2019-03-20 MED ORDER — LORAZEPAM 2 MG/ML IJ SOLN
0.5000 mg | Freq: Once | INTRAMUSCULAR | Status: AC
  Administered 2019-03-20: 23:00:00 0.5 mg via INTRAVENOUS
  Filled 2019-03-20: qty 1

## 2019-03-20 MED ORDER — SODIUM CHLORIDE 0.9 % IV BOLUS
1000.0000 mL | Freq: Once | INTRAVENOUS | Status: AC
  Administered 2019-03-20: 1000 mL via INTRAVENOUS

## 2019-03-20 MED ORDER — PROPOFOL 500 MG/50ML IV EMUL
INTRAVENOUS | Status: DC | PRN
  Administered 2019-03-20: 150 ug/kg/min via INTRAVENOUS

## 2019-03-20 MED ORDER — SODIUM CHLORIDE 0.9 % IV SOLN
8.0000 mg/h | INTRAVENOUS | Status: DC
  Administered 2019-03-20: 8 mg/h via INTRAVENOUS
  Filled 2019-03-20: qty 80

## 2019-03-20 MED ORDER — PANTOPRAZOLE SODIUM 40 MG IV SOLR
40.0000 mg | Freq: Two times a day (BID) | INTRAVENOUS | Status: DC
  Administered 2019-03-20: 12:00:00 40 mg via INTRAVENOUS

## 2019-03-20 MED ORDER — ONDANSETRON HCL 4 MG/2ML IJ SOLN
INTRAMUSCULAR | Status: AC
  Administered 2019-03-20: 04:00:00 4 mg via INTRAVENOUS
  Filled 2019-03-20: qty 2

## 2019-03-20 MED ORDER — GABAPENTIN 300 MG PO CAPS
900.0000 mg | ORAL_CAPSULE | Freq: Three times a day (TID) | ORAL | Status: DC
  Administered 2019-03-20 – 2019-03-21 (×3): 900 mg via ORAL
  Filled 2019-03-20 (×3): qty 3

## 2019-03-20 MED ORDER — ONDANSETRON HCL 4 MG PO TABS: 4.0000 mg | ORAL_TABLET | Freq: Four times a day (QID) | ORAL | Status: DC | PRN

## 2019-03-20 MED ORDER — DOCUSATE SODIUM 100 MG PO CAPS
100.0000 mg | ORAL_CAPSULE | Freq: Two times a day (BID) | ORAL | Status: DC
  Administered 2019-03-20: 100 mg via ORAL
  Filled 2019-03-20 (×2): qty 1

## 2019-03-20 MED ORDER — LIDOCAINE HCL (PF) 2 % IJ SOLN
INTRAMUSCULAR | Status: DC | PRN
  Administered 2019-03-20: 100 mg via INTRADERMAL

## 2019-03-20 MED ORDER — SODIUM CHLORIDE 0.9% IV SOLUTION
Freq: Once | INTRAVENOUS | Status: AC
  Administered 2019-03-20: 16:00:00 via INTRAVENOUS

## 2019-03-20 MED ORDER — CHLORHEXIDINE GLUCONATE CLOTH 2 % EX PADS
6.0000 | MEDICATED_PAD | Freq: Every day | CUTANEOUS | Status: DC
  Administered 2019-03-20: 12:00:00 6 via TOPICAL

## 2019-03-20 NOTE — H&P (Signed)
Lacey May is an 48 y.o. female.   Chief Complaint: Hematemesis HPI: The patient with past medical history of hypertension, severe gastric reflux, COPD and history of diabetes presents to the emergency department complaining of hematemesis.  Notably, the patient was seen in the emergency department 4 days ago and found to be anemic.  She received a blood transfusion and hematology clinic 2 days ago.  She denies blood in her stool but admits to abdominal pain.  Today the patient also reports feeling nauseous.  She thinks she may have lost consciousness as she reports awaking from a nap to find blood in her hand.  Hemoglobin was found to be 4 g/dL in the emergency department.  Blood transfusion was initiated prior to the emergency department staff called the hospitalist service for admission.   Past Medical History:  Diagnosis Date  . Asthma   . COPD (chronic obstructive pulmonary disease) ([place])   . Diabetes mellitus without complication ([place])   . Fibromyalgia   . Hypertension   . MS (multiple sclerosis) ([place])   . Restrictive airway disease   . Thyroid disease     Past Surgical History:  Procedure Laterality Date  . ABDOMINAL HYSTERECTOMY    . CESAREAN SECTION    . NISSEN FUNDOPLICATION      Family History  Adopted: Yes  Problem Relation Age of Onset  . Breast cancer Mother   . Heart failure Father   . Hypertension Father   . Lupus Brother   . Diabetes Brother   . Breast cancer Maternal Aunt   . Diabetes Maternal Grandmother   . Diabetes Maternal Grandfather   . Lung cancer Paternal Grandfather    Social History:  reports that she has been smoking cigarettes. She has been smoking about 0.50 packs per day. She has never used smokeless tobacco. She reports that she does not drink alcohol or use drugs.  Allergies:  Allergies  Allergen Reactions  . Penicillins Anaphylaxis    Other reaction(s): SHORTNESS OF BREATH  . Nefazodone Other (See Comments)    Tachacardia Other  reaction(s): OTHER  . Benzoyl Peroxide Other (See Comments) and Rash    Medications Prior to Admission  Medication Sig Dispense Refill  . albuterol (PROVENTIL HFA;VENTOLIN HFA) 108 (90 BASE) MCG/ACT inhaler Frequency:PHARMDIR   Dosage:90   MCG  Instructions:  Note:2 puff q4-6 hrs prn shortness of breath or wheezingm use with spacer. Dose: 90MCG    . beclomethasone (QVAR) 80 MCG/ACT inhaler Inhale into the lungs.    . furosemide (LASIX) 20 MG tablet Take 20 mg by mouth daily.    Marland Kitchen gabapentin (NEURONTIN) 300 MG capsule Take 900 mg by mouth 3 (three) times daily.     . Ipratropium-Albuterol (COMBIVENT) 20-100 MCG/ACT AERS respimat Inhale 1 puff into the lungs every 6 (six) hours.    . promethazine (PHENERGAN) 25 MG tablet Take 25 mg by mouth.    . traZODone (DESYREL) 50 MG tablet Take 50 mg by mouth.      Results for orders placed or performed during the hospital encounter of 03/20/19 (from the past 48 hour(s))  Comprehensive metabolic panel     Status: Abnormal   Collection Time: 03/20/19  2:33 AM  Result Value Ref Range   Sodium 139 135 - 145 mmol/L   Potassium 3.5 3.5 - 5.1 mmol/L   Chloride 109 98 - 111 mmol/L   CO2 23 22 - 32 mmol/L   Glucose, Bld 133 (H) 70 - 99 mg/dL  BUN 29 (H) 6 - 20 mg/dL   Creatinine, Ser 1.05 (H) 0.44 - 1.00 mg/dL   Calcium 7.7 (L) 8.9 - 10.3 mg/dL   Total Protein 5.2 (L) 6.5 - 8.1 g/dL   Albumin 2.4 (L) 3.5 - 5.0 g/dL   AST 23 15 - 41 U/L   ALT 15 0 - 44 U/L   Alkaline Phosphatase 25 (L) 38 - 126 U/L   Total Bilirubin 0.3 0.3 - 1.2 mg/dL   GFR calc non Af Amer >60 >60 mL/min   GFR calc Af Amer >60 >60 mL/min   Anion gap 7 5 - 15    Comment: Performed at [facility], [address] [place], [place] [address]  CBC WITH DIFFERENTIAL     Status: Abnormal   Collection Time: 03/20/19  2:33 AM  Result Value Ref Range   WBC 10.6 (H) 4.0 - 10.5 K/uL   RBC 1.87 (L) 3.87 - 5.11 MIL/uL   Hemoglobin 4.0 (LL) 12.0 - 15.0 g/dL    Comment: REPEATED  TO VERIFY Reticulocyte Hemoglobin testing may be clinically indicated, consider ordering this additional test [id number] THIS CRITICAL RESULT HAS VERIFIED AND BEEN CALLED TO PAIGE JOHNSON RN BY HANNAH MILES ON 06 12 2020 AT 0301, AND HAS BEEN READ BACK.     HCT 14.0 (LL) 36.0 - 46.0 %    Comment: REPEATED TO VERIFY THIS CRITICAL RESULT HAS VERIFIED AND BEEN CALLED TO PAIGE JOHNSON RN BY HANNAH MILES ON 06 12 2020 AT 0301, AND HAS BEEN READ BACK.     MCV 74.9 (L) 80.0 - 100.0 fL   MCH 21.4 (L) 26.0 - 34.0 pg   MCHC 28.6 (L) 30.0 - 36.0 g/dL   RDW 20.1 (H) 11.5 - 15.5 %   Platelets 194 150 - 400 K/uL   nRBC 0.0 0.0 - 0.2 %   Neutrophils Relative % 73 %   Neutro Abs 7.8 (H) 1.7 - 7.7 K/uL   Lymphocytes Relative 18 %   Lymphs Abs 1.9 0.7 - 4.0 K/uL   Monocytes Relative 6 %   Monocytes Absolute 0.7 0.1 - 1.0 K/uL   Eosinophils Relative 1 %   Eosinophils Absolute 0.1 0.0 - 0.5 K/uL   Basophils Relative 1 %   Basophils Absolute 0.1 0.0 - 0.1 K/uL   Immature Granulocytes 1 %   Abs Immature Granulocytes 0.06 0.00 - 0.07 K/uL    Comment: Performed at [facility], [address] [place], [place] [address]  Protime-INR     Status: None   Collection Time: 03/20/19  2:33 AM  Result Value Ref Range   Prothrombin Time 15.2 11.4 - 15.2 seconds   INR 1.2 0.8 - 1.2    Comment: (NOTE) INR goal varies based on device and disease states. Performed at [facility], [address] [place], [place] [address]   Type and screen [place]     Status: None (Preliminary result)   Collection Time: 03/20/19  2:33 AM  Result Value Ref Range   ABO/RH(D) A POS    Antibody Screen NEG    Sample Expiration 03/23/2019,2359    Unit Number [id number]    Blood Component Type RBC, LR IRR    Unit division 00    Status of Unit ISSUED    Transfusion Status OK TO TRANSFUSE    Crossmatch Result      COMPATIBLE Performed at [facility], [address], [place], [place] [address]  Unit Number [id number]    Blood Component Type RED CELLS,LR    Unit division 00    Status of Unit ISSUED    Transfusion Status OK TO TRANSFUSE    Crossmatch Result COMPATIBLE   Lipase, blood     Status: None   Collection Time: 03/20/19  2:33 AM  Result Value Ref Range   Lipase 26 11 - 51 U/L    Comment: Performed at [facility], [address] [place], [place] [address]  Prepare RBC     Status: None   Collection Time: 03/20/19  2:51 AM  Result Value Ref Range   Order Confirmation      ORDER PROCESSED BY BLOOD BANK Performed at [facility], [address] [place], [place] [address]   Glucose, capillary     Status: Abnormal   Collection Time: 03/20/19  4:53 AM  Result Value Ref Range   Glucose-Capillary 103 (H) 70 - 99 mg/dL  TSH     Status: None   Collection Time: 03/20/19  4:59 AM  Result Value Ref Range   TSH 1.644 0.350 - 4.500 uIU/mL    Comment: Performed by a 3rd Generation assay with a functional sensitivity of <=0.01 uIU/mL. Performed at [facility], [address] [place], [place] [address]   Hemoglobin and hematocrit, blood     Status: Abnormal   Collection Time: 03/20/19  5:59 AM  Result Value Ref Range   Hemoglobin 7.3 (L) 12.0 - 15.0 g/dL    Comment: REPEATED TO VERIFY   HCT 23.3 (L) 36.0 - 46.0 %    Comment: Performed at [facility], [address], [place], [place] [address]   Dg Chest Port 1 View  Result Date: 03/20/2019 CLINICAL DATA:  48 year old female status post blood transfusion at the [facility] recently. Shortness of breath, hematemesis. EXAM: PORTABLE CHEST 1 VIEW COMPARISON:  Portable chest 03/16/2019 and earlier. FINDINGS: Portable AP upright view at 0239 hours. Mildly lower lung volumes. Stable mediastinal contours, borderline to mild cardiomegaly. Visualized tracheal air column is within normal limits. No pneumothorax, pulmonary edema, pleural effusion or  consolidation. No definite acute pulmonary opacity. No acute osseous abnormality identified. IMPRESSION: No acute cardiopulmonary abnormality. Electronically Signed   By: Genevie Ann M.D.   On: 03/20/2019 02:56    Review of Systems  Constitutional: Negative for chills and fever.  HENT: Negative for sore throat and tinnitus.   Eyes: Negative for blurred vision and redness.  Respiratory: Negative for cough and shortness of breath.   Cardiovascular: Negative for chest pain, palpitations, orthopnea and PND.  Gastrointestinal: Positive for nausea and vomiting. Negative for abdominal pain, blood in stool, diarrhea and melena.  Genitourinary: Negative for dysuria, frequency and urgency.  Musculoskeletal: Negative for joint pain and myalgias.  Skin: Negative for rash.       No lesions  Neurological: Negative for speech change, focal weakness and weakness.  Endo/Heme/Allergies: Does not bruise/bleed easily.       No temperature intolerance  Psychiatric/Behavioral: Negative for depression and suicidal ideas.    Blood pressure 107/60, pulse 75, temperature 97.7 F (36.5 C), temperature source Axillary, resp. rate 12, height 5\' 3"  (1.6 m), weight 52.7 kg, SpO2 97 %. Physical Exam  Vitals reviewed. Constitutional: She is oriented to person, place, and time. She appears well-developed and well-nourished. No distress.  HENT:  Head: Normocephalic and atraumatic.  Mouth/Throat: Oropharynx is clear and moist.  Eyes: Pupils are equal, round, and reactive to light. Conjunctivae  and EOM are normal. No scleral icterus.  Neck: Normal range of motion. Neck supple. No JVD present. No tracheal deviation present. No thyromegaly present.  Cardiovascular: Normal rate, regular rhythm and normal heart sounds. Exam reveals no gallop and no friction rub.  No murmur heard. Respiratory: Effort normal and breath sounds normal.  GI: Soft. Bowel sounds are normal. She exhibits no distension. There is no abdominal tenderness.   Genitourinary:    Genitourinary Comments: Deferred   Musculoskeletal: Normal range of motion.        General: No edema.  Lymphadenopathy:    She has no cervical adenopathy.  Neurological: She is alert and oriented to person, place, and time. No cranial nerve deficit. She exhibits normal muscle tone.  Skin: Skin is warm and dry. No rash noted. No erythema.  Psychiatric: She has a normal mood and affect. Her behavior is normal. Judgment and thought content normal.     Assessment/Plan This is a 48 year old female admitted for hematemesis. 1.  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  The patient is received a bolus of pantoprazole and she is currently n.p.o. in anticipation of EGD.  Continue octreotide.  Consult gastroenterology. 2.  Anemia: Status post blood transfusion of 2 units RBCs.  Continue to monitor telemetry.  Serial hematocrits. 3.  COPD: Stable; albuterol as needed.  Add Spiriva. 4.  Tobacco abuse: NicoDerm patch 5.  DVT prophylaxis: SCDs 6.  GI prophylaxis: Pantoprazole The patient is a full code.  Time spent on admission orders and patient care approximately 45 minutes  Harrie Foreman, MD 03/20/2019, 7:09 AM

## 2019-03-20 NOTE — ED Notes (Signed)
ED TO INPATIENT HANDOFF REPORT  ED Nurse Name and Phone #: Amena Dockham [phone]  S Name/Age/Gender Lacey May 48 y.o. female Room/Bed: ED05A/ED05A  Code Status   Code Status: Not on file  Home/SNF/Other Home Patient oriented to: self, place, time and situation Is this baseline? Yes   Triage Complete: Triage complete  Chief Complaint Hypotension  Triage Note PT to ED via EMS from home. PT [facility] for hemoglobin of 6.5 and has felt bad since. PT threw up blood today. PT BP 91/52 at this time. PT alert oriented.    Allergies Allergies  Allergen Reactions  . Penicillins Anaphylaxis    Other reaction(s): SHORTNESS OF BREATH  . Nefazodone Other (See Comments)    Tachacardia Other reaction(s): OTHER  . Benzoyl Peroxide Other (See Comments) and Rash    Level of Care/Admitting Diagnosis ED Disposition    ED Disposition Condition [facility]: [facility] [[id number]]  Level of Care: Stepdown [14]  Covid Evaluation: Screening Protocol (No Symptoms)  Diagnosis: Hematemesis [578.0.ICD-9-CM]  Admitting Physician: Harrie Foreman [[id number]]  Attending Physician: Harrie Foreman [[id number]]  Estimated length of stay: past midnight tomorrow  Certification:: I certify this patient will need inpatient services for at least 2 midnights  PT Class (Do Not Modify): Inpatient [101]  PT Acc Code (Do Not Modify): Private [1]       B Medical/Surgery History Past Medical History:  Diagnosis Date  . Asthma   . COPD (chronic obstructive pulmonary disease) ([place])   . Diabetes mellitus without complication ([place])   . Fibromyalgia   . Hypertension   . MS (multiple sclerosis) ([place])   . Restrictive airway disease   . Thyroid disease    Past Surgical History:  Procedure Laterality Date  . ABDOMINAL HYSTERECTOMY    . CESAREAN SECTION    . NISSEN FUNDOPLICATION       A IV Location/Drains/Wounds Patient  Lines/Drains/Airways Status   Active Line/Drains/Airways    Name:   Placement date:   Placement time:   Site:   Days:   Peripheral IV 03/20/19 Anterior;Left Hand   03/20/19    0210    Hand   less than 1   Peripheral IV 03/20/19 Right Forearm   03/20/19    0300    Forearm   less than 1   Peripheral IV 03/20/19 Right Antecubital   03/20/19    0247    Antecubital   less than 1          Intake/Output Last 24 hours  Intake/Output Summary (Last 24 hours) at 03/20/2019 0402 Last data filed at 03/20/2019 0353 Gross per 24 hour  Intake 380 ml  Output -  Net 380 ml    Labs/Imaging Results for orders placed or performed during the hospital encounter of 03/20/19 (from the past 48 hour(s))  Comprehensive metabolic panel     Status: Abnormal   Collection Time: 03/20/19  2:33 AM  Result Value Ref Range   Sodium 139 135 - 145 mmol/L   Potassium 3.5 3.5 - 5.1 mmol/L   Chloride 109 98 - 111 mmol/L   CO2 23 22 - 32 mmol/L   Glucose, Bld 133 (H) 70 - 99 mg/dL   BUN 29 (H) 6 - 20 mg/dL   Creatinine, Ser 1.05 (H) 0.44 - 1.00 mg/dL   Calcium 7.7 (L) 8.9 - 10.3 mg/dL   Total Protein 5.2 (L) 6.5 - 8.1 g/dL  Albumin 2.4 (L) 3.5 - 5.0 g/dL   AST 23 15 - 41 U/L   ALT 15 0 - 44 U/L   Alkaline Phosphatase 25 (L) 38 - 126 U/L   Total Bilirubin 0.3 0.3 - 1.2 mg/dL   GFR calc non Af Amer >60 >60 mL/min   GFR calc Af Amer >60 >60 mL/min   Anion gap 7 5 - 15    Comment: Performed at [facility], [address] [place], [place] [address]  CBC WITH DIFFERENTIAL     Status: Abnormal   Collection Time: 03/20/19  2:33 AM  Result Value Ref Range   WBC 10.6 (H) 4.0 - 10.5 K/uL   RBC 1.87 (L) 3.87 - 5.11 MIL/uL   Hemoglobin 4.0 (LL) 12.0 - 15.0 g/dL    Comment: REPEATED TO VERIFY Reticulocyte Hemoglobin testing may be clinically indicated, consider ordering this additional test [id number] THIS CRITICAL RESULT HAS VERIFIED AND BEEN CALLED TO Cecilee Rosner RN BY HANNAH MILES ON 06 12 2020 AT 0301,  AND HAS BEEN READ BACK.     HCT 14.0 (LL) 36.0 - 46.0 %    Comment: REPEATED TO VERIFY THIS CRITICAL RESULT HAS VERIFIED AND BEEN CALLED TO Jacarie Pate RN BY HANNAH MILES ON 06 12 2020 AT 0301, AND HAS BEEN READ BACK.     MCV 74.9 (L) 80.0 - 100.0 fL   MCH 21.4 (L) 26.0 - 34.0 pg   MCHC 28.6 (L) 30.0 - 36.0 g/dL   RDW 20.1 (H) 11.5 - 15.5 %   Platelets 194 150 - 400 K/uL   nRBC 0.0 0.0 - 0.2 %   Neutrophils Relative % 73 %   Neutro Abs 7.8 (H) 1.7 - 7.7 K/uL   Lymphocytes Relative 18 %   Lymphs Abs 1.9 0.7 - 4.0 K/uL   Monocytes Relative 6 %   Monocytes Absolute 0.7 0.1 - 1.0 K/uL   Eosinophils Relative 1 %   Eosinophils Absolute 0.1 0.0 - 0.5 K/uL   Basophils Relative 1 %   Basophils Absolute 0.1 0.0 - 0.1 K/uL   Immature Granulocytes 1 %   Abs Immature Granulocytes 0.06 0.00 - 0.07 K/uL    Comment: Performed at [facility], [address] [place], [place] [address]  Protime-INR     Status: None   Collection Time: 03/20/19  2:33 AM  Result Value Ref Range   Prothrombin Time 15.2 11.4 - 15.2 seconds   INR 1.2 0.8 - 1.2    Comment: (NOTE) INR goal varies based on device and disease states. Performed at [facility], [address] [place], [place] [address]   Type and screen [place]     Status: None (Preliminary result)   Collection Time: 03/20/19  2:33 AM  Result Value Ref Range   ABO/RH(D) A POS    Antibody Screen NEG    Sample Expiration 03/23/2019,2359    Unit Number [id number]    Blood Component Type RBC, LR IRR    Unit division 00    Status of Unit ISSUED    Transfusion Status OK TO TRANSFUSE    Crossmatch Result      COMPATIBLE Performed at [facility], [address] [place],  [address]    Unit Number [id number]    Blood Component Type RED CELLS,LR    Unit division 00    Status of Unit ISSUED    Transfusion Status OK TO TRANSFUSE    Crossmatch Result COMPATIBLE  Lipase, blood      Status: None   Collection Time: 03/20/19  2:33 AM  Result Value Ref Range   Lipase 26 11 - 51 U/L    Comment: Performed at [facility], [address] [place], [place] [address]  Prepare RBC     Status: None   Collection Time: 03/20/19  2:51 AM  Result Value Ref Range   Order Confirmation      ORDER PROCESSED BY BLOOD BANK Performed at [facility], [address], [place], [place] [address]    Dg Chest [place] 1 View  Result Date: 03/20/2019 CLINICAL DATA:  48 year old female status post blood transfusion at the [facility] recently. Shortness of breath, hematemesis. EXAM: PORTABLE CHEST 1 VIEW COMPARISON:  Portable chest 03/16/2019 and earlier. FINDINGS: Portable AP upright view at 0239 hours. Mildly lower lung volumes. Stable mediastinal contours, borderline to mild cardiomegaly. Visualized tracheal air column is within normal limits. No pneumothorax, pulmonary edema, pleural effusion or consolidation. No definite acute pulmonary opacity. No acute osseous abnormality identified. IMPRESSION: No acute cardiopulmonary abnormality. Electronically Signed   By: Genevie Ann M.D.   On: 03/20/2019 02:56    Pending Labs [employer] (From admission, onward)    Start     Ordered   Signed and Held  TSH  Add-on,   R     Signed and Held   Signed and Held  Hemoglobin A1c  Add-on,   R     Signed and Held          Vitals/Pain Today's Vitals   03/20/19 0328 03/20/19 0332 03/20/19 0353 03/20/19 0357  BP: (!) 101/55 (!) 101/55 123/66 123/66  Pulse: 78 (!) 115 78 79  Resp: 14 14 16 16   Temp: 98.3 F (36.8 C) 98.3 F (36.8 C) 98.1 F (36.7 C) 98.1 F (36.7 C)  TempSrc: Oral Oral Oral Oral  SpO2: 100% 100% 100% 100%  Weight:      Height:      PainSc:        Isolation Precautions No active isolations  Medications Medications  sodium chloride 0.9 % bolus 1,000 mL (1,000 mLs Intravenous New Bag/Given 03/20/19 0239)    And  sodium chloride 0.9 % bolus 1,000 mL (1,000  mLs Intravenous New Bag/Given 03/20/19 0238)    And  0.9 %  sodium chloride infusion (has no administration in time range)  pantoprazole (PROTONIX) 80 mg in sodium chloride 0.9 % 250 mL (0.32 mg/mL) infusion (8 mg/hr Intravenous New Bag/Given 03/20/19 0350)  pantoprazole (PROTONIX) injection 40 mg (has no administration in time range)  octreotide (SANDOSTATIN) 500 mcg in sodium chloride 0.9 % 250 mL (2 mcg/mL) infusion (has no administration in time range)  pantoprazole (PROTONIX) 80 mg in sodium chloride 0.9 % 100 mL IVPB (0 mg Intravenous Stopped 03/20/19 0348)  octreotide (SANDOSTATIN) injection 50 mcg (50 mcg Intravenous Given 03/20/19 0326)  0.9 %  sodium chloride infusion (10 mL/hr Intravenous New Bag/Given 03/20/19 0348)  ondansetron (ZOFRAN) injection 4 mg (4 mg Intravenous Given 03/20/19 0344)    Mobility walks Low fall risk   Focused Assessments Cardiac Assessment Handoff:  Cardiac Rhythm: Normal sinus rhythm Lab Results  Component Value Date   TROPONINI <0.03 03/16/2019   No results found for: DDIMER Does the Patient currently have chest pain? No     R Recommendations: See Admitting Provider Note  Report given to:   Additional Notes:

## 2019-03-20 NOTE — Consult Note (Signed)
Name: Lacey May MRN: [id number] DOB: 1971-09-13    ADMISSION DATE:  03/20/2019 CONSULTATION DATE:  03/20/2019  REFERRING MD :  Dr. Marcille Blanco  CHIEF COMPLAINT:  Hematemesis  BRIEF PATIENT DESCRIPTION: 48 y.o. Female admitted with Hemorrhagic shock in the setting of Acute Upper GI Bleed.   SIGNIFICANT EVENTS  6/12>>Admission to Stepdown  STUDIES:  N/A  CULTURES: N/A  ANTIBIOTICS: N/A  HISTORY OF PRESENT ILLNESS:   Lacey May is a 48 year old female with a past medical history notable for asthma, COPD, diabetes, fibromyalgia, hypertension, multiple sclerosis, and hypothyroidism who presents to [facility] ED on 03/20/2019 with complaints of hematemesis.  Of note she recently presented to the ED 3 days ago on 03/17/19 due to dyspnea on exertion, generalized weakness and fatigue, of which she was found to have microcytic anemia with a hemoglobin of 6.7.  She refused admission to the hospital at that time for blood transfusion and further hematological work-up.  She was agreeable to follow-up with Oncology as outpatient, and she saw Dr. Janese Banks and received a blood transfusion at the cancer center yesterday on 6/11. Post transfusion she has continued to experience generalized malaise.  She admits to remote history of alcohol abuse, and also reports taking BC powders twice daily. She denies abdominal pain, nausea, fever, or sick contacts. Upon presentation to the ED today she was noted to be hypotensive with blood pressure 91/59.  Initial work-up in the ED revealed hemoglobin 4, hematocrit 14, creatinine 1.05, BUN 29, PT 15.2, INR 1.2, WBC's 10.6.  Chest x-ray is negative.  In the ED she has received 2L NS boluses, 2 units of uncrossed matched blood,as well as Protonix and Octreotide boluses and infusions.  Gastroenterology has been consulted.  She is being admitted to stepdown for further work-up and treatment of Hemorrhagic shock secondary to acute upper GI bleed.  PCCM is consulted for further  management.  PAST MEDICAL HISTORY :   has a past medical history of Asthma, COPD (chronic obstructive pulmonary disease) ([place]), Diabetes mellitus without complication ([employer]), Fibromyalgia, Hypertension, MS (multiple sclerosis) ([place]), Restrictive airway disease, and Thyroid disease.  has a past surgical history that includes Cesarean section and Abdominal hysterectomy. Prior to Admission medications   Medication Sig Start Date End Date Taking? Authorizing Provider  albuterol (PROVENTIL HFA;VENTOLIN HFA) 108 (90 BASE) MCG/ACT inhaler Frequency:PHARMDIR   Dosage:90   MCG  Instructions:  Note:2 puff q4-6 hrs prn shortness of breath or wheezingm use with spacer. Dose: 90MCG 06/18/11   [provider]  beclomethasone (QVAR) 80 MCG/ACT inhaler Inhale into the lungs. 01/11/15 01/11/16  [provider]  furosemide (LASIX) 20 MG tablet Take 20 mg by mouth daily. 03/18/19   [provider]  gabapentin (NEURONTIN) 300 MG capsule Take 900 mg by mouth 3 (three) times daily.  11/17/14   [provider]  Ipratropium-Albuterol (COMBIVENT) 20-100 MCG/ACT AERS respimat Inhale 1 puff into the lungs every 6 (six) hours.    [provider]  promethazine (PHENERGAN) 25 MG tablet Take 25 mg by mouth. 12/11/11   [provider]  traZODone (DESYREL) 50 MG tablet Take 50 mg by mouth. 03/03/13   [provider]   Allergies  Allergen Reactions   Penicillins Anaphylaxis    Other reaction(s): SHORTNESS OF BREATH   Nefazodone Other (See Comments)    Tachacardia Other reaction(s): OTHER   Benzoyl Peroxide Other (See Comments) and Rash    FAMILY HISTORY:  family history includes Breast cancer in her maternal aunt and  mother; Diabetes in her brother, maternal grandfather, and maternal grandmother; Heart failure in her father; Hypertension in her father; Lung cancer in her paternal grandfather; Lupus in her brother. She was adopted. SOCIAL HISTORY:  reports that she has  been smoking cigarettes. She has been smoking about 0.50 packs per day. She has never used smokeless tobacco. She reports that she does not drink alcohol or use drugs.   REVIEW OF SYSTEMS:  Positives in BOLD Constitutional: Negative for fever, chills, weight loss, +malaise/fatigue and diaphoresis.  HENT: Negative for hearing loss, ear pain, nosebleeds, congestion, sore throat, neck pain, tinnitus and ear discharge.   Eyes: Negative for blurred vision, double vision, photophobia, pain, discharge and redness.  Respiratory: Negative for cough, hemoptysis, sputum production, shortness of breath, wheezing and stridor.   Cardiovascular: Negative for chest pain, palpitations, orthopnea, claudication, leg swelling and PND.  Gastrointestinal: Negative for heartburn, nausea, vomiting, abdominal pain, diarrhea, constipation, blood in stool and melena.  Genitourinary: Negative for dysuria, urgency, frequency, hematuria and flank pain.  Musculoskeletal: Negative for myalgias, back pain, joint pain and falls.  Skin: Negative for itching and rash.  Neurological: Negative for dizziness, tingling, tremors, sensory change, speech change, focal weakness, seizures, loss of consciousness,  +generalized weakness and headaches.  Endo/Heme/Allergies: Negative for environmental allergies and polydipsia. Does not bruise/bleed easily.  SUBJECTIVE:  Pt sleeping soundly Reports being cold and wanting warm blanket No chest pain, no shortness of breath, no nausea On room air  VITAL SIGNS: Temp:  [98 F (36.7 C)-98.5 F (36.9 C)] 98.3 F (36.8 C) (06/12 0332) Pulse Rate:  [75-115] 115 (06/12 0332) Resp:  [12-20] 14 (06/12 0332) BP: (89-120)/(53-81) 101/55 (06/12 0332) SpO2:  [95 %-100 %] 100 % (06/12 0332) Weight:  [47.2 kg] 47.2 kg (06/12 0231)  PHYSICAL EXAMINATION: General: Acutely ill-appearing female, laying in bed, asleep, on room air, in no acute distress Neuro: Asleep, arouses to voice, alert and  oriented, follows commands, no focal deficits HEENT: Atraumatic, normocephalic, neck supple, no JVD Cardiovascular: Regular rate and rhythm, S1-S2, no murmurs rubs or gallops, 2+ pulses Lungs: Clear to auscultation bilaterally, even, nonlabored, normal effort Abdomen: Soft, nontender, nondistended, no guarding or rebound tenderness Musculoskeletal: Generalized weakness, no deformities, no edema Skin: Warm and dry, no obvious rashes lesions or ulcerations  Recent Labs  Lab 03/16/19 2332 03/20/19 0233  NA 140 139  K 4.0 3.5  CL 107 109  CO2 26 23  BUN 10 29*  CREATININE 0.76 1.05*  GLUCOSE 94 133*   Recent Labs  Lab 03/16/19 2332 03/20/19 0233  HGB 6.7* 4.0*  HCT 23.2* 14.0*  WBC 5.6 10.6*  PLT 205 194   Dg Chest Port 1 View  Result Date: 03/20/2019 CLINICAL DATA:  48 year old female status post blood transfusion at the [place] recently. Shortness of breath, hematemesis. EXAM: PORTABLE CHEST 1 VIEW COMPARISON:  Portable chest 03/16/2019 and earlier. FINDINGS: Portable AP upright view at 0239 hours. Mildly lower lung volumes. Stable mediastinal contours, borderline to mild cardiomegaly. Visualized tracheal air column is within normal limits. No pneumothorax, pulmonary edema, pleural effusion or consolidation. No definite acute pulmonary opacity. No acute osseous abnormality identified. IMPRESSION: No acute cardiopulmonary abnormality. Electronically Signed   By: Genevie Ann M.D.   On: 03/20/2019 02:56    ASSESSMENT / PLAN:  Acute Upper GI Bleed -NPO -Continue Protonix & Octreotide drips -GI consulted, appreciate input -Transfuse pRBC's as indicated -Trend H&H q6h  Hemorrhagic Shock -Cardiac monitoring -Maintain MAP >65 -IV Fluids -Transfusion as  indicated>> received 2 units pRBC's in ED -Levophed if needed to maintain MAP goal  Acute Blood Loss Anemia -Monitor for S/Sx of bleeding -Trend CBC -SCD's for VTE Prophylaxis (no chemical prophylaxis given GI  Bleed) -Transfuse for Hgb <8        DISPOSITION: Stepdown GOALS OF CARE: Full Code VTE PROPHYLAXIS: SCD's UPDATES: Updated pt at bedside 03/20/19.  Darel Hong, AGACNP-BC [facility] Pager: [phone] Cell: [phone]  03/20/2019, 3:49 AM

## 2019-03-20 NOTE — Telephone Encounter (Signed)
Called pt for appt pre-screen but got no answer. Left vm msg about screening and new guidelines about mask req, no visitors, screening questions they will be asked, and fever checks °

## 2019-03-20 NOTE — Progress Notes (Signed)
Patient ID: Lacey May, female   DOB: 1970-10-14, 48 y.o.   MRN: [id number]  [employer] PROGRESS NOTE  ESTELL PUCCINI XTK:[id number] DOB: 11-25-1970 DOA: 03/20/2019 PCP: [facility]  HPI/Subjective: Patient feeling sleepy after endoscopy.  She has been taking BC powder and she eats ice.  Patient came in with hematemesis and found to have a hemoglobin of 4.  Patient was ordered 2 units of blood and hemoglobin came up to 7.3.  Objective: Vitals:   03/20/19 1110 03/20/19 1120  BP: 112/65 111/69  Pulse: 61 62  Resp: 11 12  Temp:    SpO2: 100% 97%    Filed Weights   03/20/19 0231 03/20/19 0450 03/20/19 0940  Weight: 47.2 kg 52.7 kg 52.7 kg    ROS: Review of Systems  Constitutional: Negative for chills and fever.  Eyes: Negative for blurred vision.  Respiratory: Negative for cough and shortness of breath.   Cardiovascular: Negative for chest pain.  Gastrointestinal: Positive for nausea. Negative for abdominal pain, constipation, diarrhea and vomiting.  Genitourinary: Negative for dysuria.  Musculoskeletal: Negative for joint pain.  Neurological: Negative for dizziness and headaches.   Exam: Physical Exam  Constitutional: She is oriented to person, place, and time.  HENT:  Nose: No mucosal edema.  Mouth/Throat: No oropharyngeal exudate or posterior oropharyngeal edema.  Eyes: Pupils are equal, round, and reactive to light. Conjunctivae and EOM are normal.  Left eyelid swollen.  Neck: No JVD present. Carotid bruit is not present. No edema present. No thyroid mass and no thyromegaly present.  Cardiovascular: S1 normal and S2 normal. Exam reveals no gallop.  No murmur heard. Pulses:      Dorsalis pedis pulses are 2+ on the right side and 2+ on the left side.  Respiratory: No respiratory distress. She has no wheezes. She has no rhonchi. She has no rales.  GI: Soft. Bowel sounds are normal. There is no abdominal tenderness.  Musculoskeletal:     Right  ankle: She exhibits swelling.     Left ankle: She exhibits swelling.  Lymphadenopathy:    She has no cervical adenopathy.  Neurological: She is alert and oriented to person, place, and time. No cranial nerve deficit.  Skin: Skin is warm. No rash noted. Nails show no clubbing.  Psychiatric: She has a normal mood and affect.      Data Reviewed: Basic Metabolic Panel: Recent Labs  Lab 03/16/19 2332 03/20/19 0233  NA 140 139  K 4.0 3.5  CL 107 109  CO2 26 23  GLUCOSE 94 133*  BUN 10 29*  CREATININE 0.76 1.05*  CALCIUM 8.7* 7.7*   Liver Function Tests: Recent Labs  Lab 03/16/19 2332 03/20/19 0233  AST 45* 23  ALT 30 15  ALKPHOS 32* 25*  BILITOT 0.2* 0.3  PROT 6.5 5.2*  ALBUMIN 2.8* 2.4*   Recent Labs  Lab 03/20/19 0233  LIPASE 26   CBC: Recent Labs  Lab 03/16/19 2332 03/20/19 0233 03/20/19 0559  WBC 5.6 10.6*  --   NEUTROABS 3.0 7.8*  --   HGB 6.7* 4.0* 7.3*  HCT 23.2* 14.0* 23.3*  MCV 74.4* 74.9*  --   PLT 205 194  --    Cardiac Enzymes: Recent Labs  Lab 03/16/19 2332  TROPONINI <0.03   BNP (last 3 results) Recent Labs    03/16/19 2332  BNP 95.0     CBG: Recent Labs  Lab 03/20/19 0453  GLUCAP 103*    Recent Results (from the  past 240 hour(s))  Urine Culture     Status: Abnormal   Collection Time: 03/17/19 12:34 AM   Specimen: Urine, Random  Result Value Ref Range Status   Specimen Description   Final    URINE, RANDOM Performed at [facility], [address], [place], [place] [address]    Special Requests   Final    Normal Performed at [facility], [place], [place] [address]    Culture (A)  Final    >=100,000 COLONIES/mL ESCHERICHIA COLI >=100,000 COLONIES/mL STREPTOCOCCUS GALLOLYTICUS    Report Status 03/20/2019 FINAL  Final   Organism ID, Bacteria ESCHERICHIA COLI (A)  Final   Organism ID, Bacteria STREPTOCOCCUS GALLOLYTICUS (A)  Final      Susceptibility   Escherichia coli - MIC*     AMPICILLIN >=32 RESISTANT Resistant     CEFAZOLIN <=4 SENSITIVE Sensitive     CEFTRIAXONE <=1 SENSITIVE Sensitive     CIPROFLOXACIN 0.5 SENSITIVE Sensitive     GENTAMICIN <=1 SENSITIVE Sensitive     IMIPENEM <=0.25 SENSITIVE Sensitive     NITROFURANTOIN <=16 SENSITIVE Sensitive     TRIMETH/SULFA >=320 RESISTANT Resistant     AMPICILLIN/SULBACTAM >=32 RESISTANT Resistant     PIP/TAZO <=4 SENSITIVE Sensitive     Extended ESBL NEGATIVE Sensitive     * >=100,000 COLONIES/mL ESCHERICHIA COLI   Streptococcus gallolyticus - MIC*    PENICILLIN 0.12 SENSITIVE Sensitive     CEFTRIAXONE 0.25 SENSITIVE Sensitive     ERYTHROMYCIN >=8 RESISTANT Resistant     LEVOFLOXACIN 2 SENSITIVE Sensitive     VANCOMYCIN 0.5 SENSITIVE Sensitive     * >=100,000 COLONIES/mL STREPTOCOCCUS GALLOLYTICUS  Novel Coronavirus, NAA (hospital order; send-out to ref lab)     Status: None   Collection Time: 03/17/19  1:38 AM   Specimen: Nasopharyngeal Swab; Respiratory  Result Value Ref Range Status   SARS-CoV-2, NAA NOT DETECTED NOT DETECTED Final    Comment: (NOTE) This test was developed and its performance characteristics determined by [employer]. This test has not been FDA cleared or approved. This test has been authorized by FDA under an Emergency Use Authorization (EUA). This test is only authorized for the duration of time the declaration that circumstances exist justifying the authorization of the emergency use of in vitro diagnostic tests for detection of SARS-CoV-2 virus and/or diagnosis of COVID-19 infection under section 564(b)(1) of the Act, 21 U.S.C. 109NAT-5(T)(7), unless the authorization is terminated or revoked sooner. When diagnostic testing is negative, the possibility of a false negative result should be considered in the context of a patient's recent exposures and the presence of clinical signs and symptoms consistent with COVID-19. An individual without symptoms of COVID-19 and who is  not shedding SARS-CoV-2 virus would expect to have a negative (not detected) result in this assay. Performed  At: [facility] [address] [place], [place] [id number] Rush Farmer MD CW:[id number]    [place]  Final    Comment: Performed at [facility], [address] [place], [place] [address]  MRSA PCR Screening     Status: None   Collection Time: 03/20/19  5:59 AM   Specimen: Nasopharyngeal  Result Value Ref Range Status   MRSA by PCR NEGATIVE NEGATIVE Final    Comment:        The GeneXpert MRSA Assay (FDA approved for NASAL specimens only), is one component of a comprehensive MRSA colonization surveillance program. It is not intended to diagnose MRSA  infection nor to guide or monitor treatment for MRSA infections. Performed at [facility], [address], [place], [place] [address]      Studies: Dg Chest [place] 1 View  Result Date: 03/20/2019 CLINICAL DATA:  48 year old female status post blood transfusion at the [place] recently. Shortness of breath, hematemesis. EXAM: PORTABLE CHEST 1 VIEW COMPARISON:  Portable chest 03/16/2019 and earlier. FINDINGS: Portable AP upright view at 0239 hours. Mildly lower lung volumes. Stable mediastinal contours, borderline to mild cardiomegaly. Visualized tracheal air column is within normal limits. No pneumothorax, pulmonary edema, pleural effusion or consolidation. No definite acute pulmonary opacity. No acute osseous abnormality identified. IMPRESSION: No acute cardiopulmonary abnormality. Electronically Signed   By: Genevie Ann M.D.   On: 03/20/2019 02:56    Scheduled Meds: . Chlorhexidine Gluconate Cloth  6 each Topical Daily  . docusate sodium  100 mg Oral BID  . nicotine  21 mg Transdermal Daily  . [START ON 03/23/2019] pantoprazole  40 mg Intravenous Q12H   Continuous Infusions: . cefTRIAXone (ROCEPHIN)  IV      Assessment/Plan:   1. Acute blood loss anemia.   Patient required 2 units of packed red blood cells.  Likely will require more.  I added on iron studies.  Serial hemoglobins. 2. Gastric ulcer with bleeding.  Endoscopy showing a lot of blood in the stomach and a gastric ulcer.  They recommended Protonix drip for 72 hours and n.p.o. for today.  Probably slow advance for diet.  High risk for rebleed.  Advised not to take any NSAIDs or alcohol. 3. Pica with ice eating.  Patient advised not to eat ice.  Likely will be iron deficient anemia. 4. COPD.  Continue inhalers 5. Fibromyalgia  Code Status:     Code Status Orders  (From admission, onward)         Start     Ordered   03/20/19 0450  Full code  Continuous     03/20/19 0449        Code Status History    This patient has a current code status but no historical code status.   Advance Care Planning Activity     Family Communication: Spoke with son on the phone Disposition Plan: TBD  Consultants:  Gastroenterology  Critical care team  Procedures:  Upper endoscopy  Time spent: 52 minutes  [place]

## 2019-03-20 NOTE — Anesthesia Postprocedure Evaluation (Signed)
Anesthesia Post Note  Patient: Lacey May  Procedure(s) Performed: ESOPHAGOGASTRODUODENOSCOPY (EGD) WITH PROPOFOL (N/A )  Patient location during evaluation: Endoscopy Anesthesia Type: General Level of consciousness: awake and alert Pain management: pain level controlled Vital Signs Assessment: post-procedure vital signs reviewed and stable Respiratory status: spontaneous breathing, nonlabored ventilation, respiratory function stable and patient connected to nasal cannula oxygen Cardiovascular status: blood pressure returned to baseline and stable Postop Assessment: no apparent nausea or vomiting Anesthetic complications: no     Last Vitals:  Vitals:   03/20/19 1110 03/20/19 1120  BP: 112/65 111/69  Pulse: 61 62  Resp: 11 12  Temp:    SpO2: 100% 97%    Last Pain:  Vitals:   03/20/19 1120  TempSrc:   PainSc: 0-No pain                 Martha Clan

## 2019-03-20 NOTE — Transfer of Care (Signed)
Immediate Anesthesia Transfer of Care Note  Patient: Lacey May  Procedure(s) Performed: ESOPHAGOGASTRODUODENOSCOPY (EGD) WITH PROPOFOL (N/A )  Patient Location: PACU  Anesthesia Type:General  Level of Consciousness: sedated  Airway & Oxygen Therapy: Patient Spontanous Breathing and Patient connected to nasal cannula oxygen  Post-op Assessment: Report given to RN and Post -op Vital signs reviewed and stable  Post vital signs: Reviewed and stable  Last Vitals:  Vitals Value Taken Time  BP    Temp    Pulse    Resp    SpO2      Last Pain:  Vitals:   03/20/19 0940  TempSrc: Tympanic  PainSc: 0-No pain         Complications: No apparent anesthesia complications

## 2019-03-20 NOTE — ED Notes (Signed)
Son updated

## 2019-03-20 NOTE — Anesthesia Preprocedure Evaluation (Signed)
Anesthesia Evaluation  Patient identified by MRN, date of birth, ID band Patient awake    Reviewed: Allergy & Precautions, H&P , NPO status , Patient's Chart, lab work & pertinent test results, reviewed documented beta blocker date and time   History of Anesthesia Complications (+) PONV and history of anesthetic complications  Airway Mallampati: II  TM Distance: >3 FB Neck ROM: full    Dental  (+) Dental Advidsory Given, Upper Dentures, Lower Dentures, Edentulous Upper, Edentulous Lower   Pulmonary neg shortness of breath, asthma , COPD, neg recent URI, Current Smoker,           Cardiovascular Exercise Tolerance: Good hypertension, (-) angina(-) Past MI (-) dysrhythmias (-) Valvular Problems/Murmurs     Neuro/Psych neg Seizures  Neuromuscular disease (fibromyalgia) negative psych ROS   GI/Hepatic Neg liver ROS, GERD  ,  Endo/Other  diabetes  Renal/GU negative Renal ROS  negative genitourinary   Musculoskeletal   Abdominal   Peds  Hematology  (+) Blood dyscrasia, anemia ,   Anesthesia Other Findings Past Medical History: No date: Asthma No date: COPD (chronic obstructive pulmonary disease) ([facility]) No date: Diabetes mellitus without complication ([place]) No date: Fibromyalgia No date: Hypertension No date: MS (multiple sclerosis) ([place]) No date: Restrictive airway disease No date: Thyroid disease  Denies drug use  Reproductive/Obstetrics negative OB ROS                             Anesthesia Physical Anesthesia Plan  ASA: III  Anesthesia Plan: General   Post-op Pain Management:    Induction: Intravenous  PONV Risk Score and Plan: 3 and Propofol infusion and TIVA  Airway Management Planned: Natural Airway and Nasal Cannula  Additional Equipment:   Intra-op Plan:   Post-operative Plan:   Informed Consent: I have reviewed the patients History and Physical, chart, labs and  discussed the procedure including the risks, benefits and alternatives for the proposed anesthesia with the patient or authorized representative who has indicated his/her understanding and acceptance.     Dental Advisory Given  Plan Discussed with: Anesthesiologist, CRNA and Surgeon  Anesthesia Plan Comments:         Anesthesia Quick Evaluation

## 2019-03-20 NOTE — Consult Note (Signed)
Jonathon Bellows , MD [address], [place], [place], [place], [address] [address], [place], [place], [place], [address] Phone: [phone]  Fax: [phone]  Consultation  Referring Provider:    Hematemesis Primary Care Physician:  Angelene Giovanni Primary Care Primary Gastroenterologist:  None         Reason for Consultation:     Hematemesis  Date of Admission:  03/20/2019 Date of Consultation:  03/20/2019         HPI:   Lacey May is a 48 y.o. female presented to the ER early this morning with hematemesis.   Recently on 03/17/2019 seen at her PCP office , seen the night before at the ER and noted to have a low Hb at 6.7 . Subsequently referred to hematology and had a blood transfusion .   New onset microcytic anemia compared to last result we have 3 years back. Ferritin 3 . Today very drowsy in the ICU says all of a sudden threw up blood yesterday, cant quantify, no alcohol in 2 years, daily usage of BC powder long standing, no abdominal pain. Denies having a bowel movement. On presentation to the ER Hb 4 grams . Elevation in creatinine to 1.05 Past Medical History:  Diagnosis Date  . Asthma   . COPD (chronic obstructive pulmonary disease) ([facility])   . Diabetes mellitus without complication ([facility])   . Fibromyalgia   . Hypertension   . MS (multiple sclerosis) ([facility])   . Restrictive airway disease   . Thyroid disease     Past Surgical History:  Procedure Laterality Date  . ABDOMINAL HYSTERECTOMY    . CESAREAN SECTION    . NISSEN FUNDOPLICATION      Prior to Admission medications   Medication Sig Start Date End Date Taking? Authorizing Provider  albuterol (PROVENTIL HFA;VENTOLIN HFA) 108 (90 BASE) MCG/ACT inhaler Frequency:PHARMDIR   Dosage:90   MCG  Instructions:  Note:2 puff q4-6 hrs prn shortness of breath or wheezingm use with spacer. Dose: 90MCG 06/18/11   [provider]  beclomethasone (QVAR) 80 MCG/ACT inhaler Inhale into the lungs. 01/11/15 01/11/16   [provider]  furosemide (LASIX) 20 MG tablet Take 20 mg by mouth daily. 03/18/19   [provider]  gabapentin (NEURONTIN) 300 MG capsule Take 900 mg by mouth 3 (three) times daily.  11/17/14   [provider]  Ipratropium-Albuterol (COMBIVENT) 20-100 MCG/ACT AERS respimat Inhale 1 puff into the lungs every 6 (six) hours.    [provider]  promethazine (PHENERGAN) 25 MG tablet Take 25 mg by mouth. 12/11/11   [provider]  traZODone (DESYREL) 50 MG tablet Take 50 mg by mouth. 03/03/13   [provider]    Family History  Adopted: Yes  Problem Relation Age of Onset  . Breast cancer Mother   . Heart failure Father   . Hypertension Father   . Lupus Brother   . Diabetes Brother   . Breast cancer Maternal Aunt   . Diabetes Maternal Grandmother   . Diabetes Maternal Grandfather   . Lung cancer Paternal Grandfather      Social History   Tobacco Use  . Smoking status: Current Every Day Smoker    Packs/day: 0.50    Types: Cigarettes  . Smokeless tobacco: Never Used  Substance Use Topics  . Alcohol use: No    Alcohol/week: 0.0 standard drinks  . Drug use: No    Allergies as of 03/20/2019 - Review Complete 03/20/2019  Allergen Reaction  Noted  . Penicillins Anaphylaxis 02/02/2015  . Nefazodone Other (See Comments) 02/02/2015  . Benzoyl peroxide Other (See Comments) and Rash 02/02/2015    Review of Systems:    All systems reviewed and negative except where noted in HPI.   Physical Exam:  Vital signs in last 24 hours: Temp:  [97.7 F (36.5 C)-98.5 F (36.9 C)] 97.7 F (36.5 C) (06/12 0450) Pulse Rate:  [70-115] 73 (06/12 0700) Resp:  [11-20] 11 (06/12 0700) BP: (89-125)/(53-81) 125/74 (06/12 0700) SpO2:  [95 %-100 %] 99 % (06/12 0700) Weight:  [47.2 kg-52.7 kg] 52.7 kg (06/12 0450)   General:   Pleasant, cooperative in NAD Head:  Normocephalic and atraumatic. Eyes:   No icterus.   Conjunctiva pink. PERRLA. Ears:   Normal auditory acuity. Neck:  Supple; no masses or thyroidomegaly Lungs: Respirations even and unlabored. Lungs clear to auscultation bilaterally.   No wheezes, crackles, or rhonchi.  Heart:  Regular rate and rhythm;  Without murmur, clicks, rubs or gallops Abdomen:  Soft, nondistended, nontender. Normal bowel sounds. No appreciable masses or hepatomegaly.  No rebound or guarding.  Neurologic:  Alert and oriented x3;  grossly normal neurologically. Skin:  Intact without significant lesions or rashes. Cervical Nodes:  No significant cervical adenopathy. Psych:  Alert and cooperative. Normal affect.  LAB RESULTS: Recent Labs    03/20/19 0233 03/20/19 0559  WBC 10.6*  --   HGB 4.0* 7.3*  HCT 14.0* 23.3*  PLT 194  --    BMET Recent Labs    03/20/19 0233  NA 139  K 3.5  CL 109  CO2 23  GLUCOSE 133*  BUN 29*  CREATININE 1.05*  CALCIUM 7.7*   LFT Recent Labs    03/20/19 0233  PROT 5.2*  ALBUMIN 2.4*  AST 23  ALT 15  ALKPHOS 25*  BILITOT 0.3   PT/INR Recent Labs    03/20/19 0233  LABPROT 15.2  INR 1.2    STUDIES: Dg Chest Port 1 View  Result Date: 03/20/2019 CLINICAL DATA:  48 year old female status post blood transfusion at the [facility] recently. Shortness of breath, hematemesis. EXAM: PORTABLE CHEST 1 VIEW COMPARISON:  Portable chest 03/16/2019 and earlier. FINDINGS: Portable AP upright view at 0239 hours. Mildly lower lung volumes. Stable mediastinal contours, borderline to mild cardiomegaly. Visualized tracheal air column is within normal limits. No pneumothorax, pulmonary edema, pleural effusion or consolidation. No definite acute pulmonary opacity. No acute osseous abnormality identified. IMPRESSION: No acute cardiopulmonary abnormality. Electronically Signed   By: Genevie Ann M.D.   On: 03/20/2019 02:56      Impression / Plan:   Lacey May is a 48 y.o. y/o female admitted with an upper GI bleed. Likely secondary to chronic NSAID use. Recently  diagnosed with iron deficiency anemia. INR 1.2, normal platelet count. Less likely has portal hypertension and therefore less likely a variceal bleed.   Plan  1. Monitor CBC and transfuse 2. Continue PPI+octreotide 3. Stop all NSAID's 4. F/u b12 levels  5. IV Iron  6. EGD today    I have discussed alternative options, risks & benefits,  which include, but are not limited to, bleeding, infection, perforation,respiratory complication & drug reaction.  The patient agrees with this plan & written consent will be obtained.     Thank you for involving me in the care of this patient.      LOS: 0 days   Jonathon Bellows, MD  03/20/2019, 8:55 AM

## 2019-03-20 NOTE — Anesthesia Procedure Notes (Signed)
Date/Time: 03/20/2019 10:20 AM Performed by: Nelda Marseille, CRNA Pre-anesthesia Checklist: Patient identified, Emergency Drugs available, Suction available, Patient being monitored and Timeout performed Oxygen Delivery Method: Nasal cannula

## 2019-03-20 NOTE — Progress Notes (Signed)
Non-obstructing non-bleeding gastric ulcer with a clean ulcer base (Forrest Class III).  NSAID induced etiology. There is no evidence of perforation.  H/H stable VS stable  Ok to transfer to gen med floor.    Corrin Parker, M.D.  [facility]  Medical Director [facility] Director [facility]io[facility]

## 2019-03-20 NOTE — ED Triage Notes (Signed)
PT to ED via EMS from home. PT [facility] transfusion [facility] yesterday for hemoglobin of 6.5 and has felt bad since. PT threw up blood today. PT BP 91/52 at this time. PT alert oriented.

## 2019-03-20 NOTE — Progress Notes (Signed)
Per Kennyth Lose, RN the nurse that is taking care of the patient in ICU, the patient states, I have been in self isolation since the Covid-19 test performed in the [facility] ED" on 03-17-2019 with the results being noted as negative.  Will proceed with Upper Endoscopy with Dr Vicente Males.

## 2019-03-20 NOTE — Op Note (Signed)
[facility] Gastroenterology Patient Name: Camrie Stock Procedure Date: 03/20/2019 10:21 AM MRN: [id number] Account #: [id number] Date of Birth: 1971-03-29 Admit Type: Inpatient Age: 48 Room: [facility] ENDO ROOM 3 Gender: Female Note Status: Finalized Procedure:            Upper GI endoscopy Indications:          Hematemesis Providers:            Jonathon Bellows MD, MD Medicines:            Monitored Anesthesia Care Complications:        No immediate complications. Procedure:            Pre-Anesthesia Assessment:                       - Prior to the procedure, a History and Physical was                        performed, and patient medications, allergies and                        sensitivities were reviewed. The patient's tolerance of                        previous anesthesia was reviewed.                       - The risks and benefits of the procedure and the                        sedation options and risks were discussed with the                        patient. All questions were answered and informed                        consent was obtained.                       - ASA Grade Assessment: III - A patient with severe                        systemic disease.                       After obtaining informed consent, the endoscope was                        passed under direct vision. Throughout the procedure,                        the patient's blood pressure, pulse, and oxygen                        saturations were monitored continuously. The Endoscope                        was introduced through the mouth, and advanced to the                        third part of duodenum. The upper GI endoscopy was  accomplished with ease. The patient tolerated the                        procedure well. Findings:      The esophagus was normal.      The examined duodenum was normal.      One non-obstructing non-bleeding cratered gastric ulcer of significant      severity with a clean ulcer base (Forrest Class III) was found in the       gastric fundus. The lesion was 10 mm in largest dimension. There is no       evidence of perforation.      Clotted blood was found in the gastric body. A very large clot seen in       the body occupying > 50% , too large to move or dislodge , I moved the       positon of the patient and the clot shifted and i was able to see       underneath the large very deep clean based ulcer with no bleeding. No       other sites of active bleeding seen. Impression:           - Normal esophagus.                       - Normal examined duodenum.                       - Non-obstructing non-bleeding gastric ulcer with a                        clean ulcer base (Forrest Class III). NSAID induced                        etiology. There is no evidence of perforation.                       - Clotted blood in the gastric body.                       - No specimens collected. Recommendation:       - Return patient to hospital ward for ongoing care.                       - 1. NPO today                       2. IV PPI GTT for 72 hours - very deep large ulcer, if                        she rebleeds endotherapy may be difficulty due to                        location and size of the ulcer- hemospray can be                        attempted if actively bleeding .                       3. Check H pylori stool antigen  4. Consider second look endoscopy if Hb drops as the                        large clot was oscuring visualization and its always                        possible anopther ulcer may be underneath                       5. If no further bleeding ,. she will need repeat EGD                        in 8-10 weeks                       6. No NSAID's                       7. IF has severe abdominal pain- get abdominal imaging                        due to dept of the ulcer - perforation is at high risk Procedure  Code(s):    --- Professional ---                       [phone], Esophagogastroduodenoscopy, flexible, transoral;                        diagnostic, including collection of specimen(s) by                        brushing or washing, when performed (separate procedure) Diagnosis Code(s):    --- Professional ---                       T39.395S, Adverse effect of other nonsteroidal                        anti-inflammatory drugs [NSAID], sequela                       K25.9, Gastric ulcer, unspecified as acute or chronic,                        without hemorrhage or perforation                       K92.2, Gastrointestinal hemorrhage, unspecified                       K92.0, Hematemesis CPT copyright 2019 [employer]. All rights reserved. The codes documented in this report are preliminary and upon coder review may  be revised to meet current compliance requirements. Jonathon Bellows, MD Jonathon Bellows MD, MD 03/20/2019 10:49:09 AM This report has been signed electronically. Number of Addenda: 0 Note Initiated On: 03/20/2019 10:21 AM Estimated Blood Loss: Estimated blood loss: none.      [facility]

## 2019-03-20 NOTE — ED Notes (Signed)
Blood rate changed from 168mL/hr to 930mL/hr per Dr. Owens Shark

## 2019-03-20 NOTE — ED Notes (Signed)
MD made aware of critical hemoglobin and hematocrit. Orders for stat blood. Tech to blood bank to get blood at this time.

## 2019-03-20 NOTE — ED Provider Notes (Signed)
[facility] Emergency Department Provider Note __   First MD Initiated Contact with Patient 03/20/19 [phone]     (approximate)  I have reviewed the triage vital signs and the nursing notes.   HISTORY  Chief Complaint Hypotension and Hematemesis    HPI Lacey May is a 48 y.o. female with below list of previous medical conditions including recent visit to the emergency department secondary to symptomatic anemia and visited oncology yesterday where the patient received a blood transfusion evaluated by Dr. Janese Banks returns to the emergency department tonight via EMS secondary to hematemesis, hypotension and generalized weakness.  Patient does admit to remote history of alcohol abuse.  Patient also admits to taking BC powders twice daily.  Patient denies any abdominal pain at present.  Patient denies any fever.  Patient denies any nausea or vomiting     Past Medical History:  Diagnosis Date   Asthma    COPD (chronic obstructive pulmonary disease) ([place])    Diabetes mellitus without complication ([place])    Fibromyalgia    Hypertension    MS (multiple sclerosis) ([place])    Restrictive airway disease    Thyroid disease     Patient Active Problem List   Diagnosis Date Noted   Hematemesis 03/20/2019   Iron deficiency anemia 03/18/2019   LLQ pain 02/11/2018   Abnormal uterine bleeding 02/11/2018   Vaginal atrophy 02/11/2018   Galactorrhea 02/11/2018   Palpitations 02/02/2015    Past Surgical History:  Procedure Laterality Date   ABDOMINAL HYSTERECTOMY     CESAREAN SECTION     NISSEN FUNDOPLICATION      Prior to Admission medications   Medication Sig Start Date End Date Taking? Authorizing Provider  albuterol (PROVENTIL HFA;VENTOLIN HFA) 108 (90 BASE) MCG/ACT inhaler Frequency:PHARMDIR   Dosage:90   MCG  Instructions:  Note:2 puff q4-6 hrs prn shortness of breath or wheezingm use with spacer. Dose: 90MCG 06/18/11   [provider]    beclomethasone (QVAR) 80 MCG/ACT inhaler Inhale into the lungs. 01/11/15 01/11/16  [provider]  furosemide (LASIX) 20 MG tablet Take 20 mg by mouth daily. 03/18/19   [provider]  gabapentin (NEURONTIN) 300 MG capsule Take 900 mg by mouth 3 (three) times daily.  11/17/14   [provider]  Ipratropium-Albuterol (COMBIVENT) 20-100 MCG/ACT AERS respimat Inhale 1 puff into the lungs every 6 (six) hours.    [provider]  promethazine (PHENERGAN) 25 MG tablet Take 25 mg by mouth. 12/11/11   [provider]  traZODone (DESYREL) 50 MG tablet Take 50 mg by mouth. 03/03/13   [provider]    Allergies Penicillins, Nefazodone, and Benzoyl peroxide  Family History  Adopted: Yes  Problem Relation Age of Onset   Breast cancer Mother    Heart failure Father    Hypertension Father    Lupus Brother    Diabetes Brother    Breast cancer Maternal Aunt    Diabetes Maternal Grandmother    Diabetes Maternal Grandfather    Lung cancer Paternal Grandfather     Social History Social History   Tobacco Use   Smoking status: Current Every Day Smoker    Packs/day: 0.50    Types: Cigarettes   Smokeless tobacco: Never Used  Substance Use Topics   Alcohol use: No    Alcohol/week: 0.0 standard drinks   Drug use: No    Review of Systems Constitutional: No fever/chills Eyes: No visual changes. ENT: No sore throat. Cardiovascular: Denies  chest pain. Respiratory: Denies shortness of breath. Gastrointestinal: No abdominal pain.  No nausea, no vomiting.  No diarrhea.  No constipation.  Positive for hematemesis Genitourinary: Negative for dysuria. Musculoskeletal: Negative for neck pain.  Negative for back pain. Integumentary: Negative for rash. Neurological: Negative for headaches, focal weakness or numbness.  Positive for generalized weakness   ____________________________________________   PHYSICAL EXAM:  VITAL SIGNS: ED  Triage Vitals  Enc Vitals Group     BP 03/20/19 0227 (!) 91/59     Pulse Rate 03/20/19 0227 89     Resp 03/20/19 0227 16     Temp 03/20/19 0231 98 F (36.7 C)     Temp Source 03/20/19 0231 Oral     SpO2 03/20/19 0227 100 %     Weight 03/20/19 0231 47.2 kg (104 lb)     Height 03/20/19 0231 1.6 m (5\' 3" )     Head Circumference --      Peak Flow --      Pain Score 03/20/19 0228 0     Pain Loc --      Pain Edu? --      Excl. in [place]? --     Constitutional: Alert and oriented. Well appearing and in no acute distress. Eyes:   Pale conjunctiva Mouth/Throat: Mucous membranes are moist. Oropharynx non-erythematous. Neck: No stridor.   Cardiovascular: Normal rate, regular rhythm. Good peripheral circulation. Grossly normal heart sounds. Respiratory: Normal respiratory effort.  No retractions. No audible wheezing. Gastrointestinal: Soft and nontender. No distention.  Musculoskeletal: No lower extremity tenderness nor edema. No gross deformities of extremities. Neurologic:  Normal speech and language. No gross focal neurologic deficits are appreciated.  Skin:  Skin is warm, dry and intact. No rash noted. Psychiatric: Mood and affect are normal. Speech and behavior are normal.  ____________________________________________   LABS (all labs ordered are listed, but only abnormal results are displayed)  Labs Reviewed  COMPREHENSIVE METABOLIC PANEL - Abnormal; Notable for the following components:      Result Value   Glucose, Bld 133 (*)    BUN 29 (*)    Creatinine, Ser 1.05 (*)    Calcium 7.7 (*)    Total Protein 5.2 (*)    Albumin 2.4 (*)    Alkaline Phosphatase 25 (*)    All other components within normal limits  CBC WITH DIFFERENTIAL/PLATELET - Abnormal; Notable for the following components:   WBC 10.6 (*)    RBC 1.87 (*)    Hemoglobin 4.0 (*)    HCT 14.0 (*)    MCV 74.9 (*)    MCH 21.4 (*)    MCHC 28.6 (*)    RDW 20.1 (*)    Neutro Abs 7.8 (*)    All other components within  normal limits  PROTIME-INR  LIPASE, BLOOD  POC URINE PREG, ED  TYPE AND SCREEN  PREPARE RBC (CROSSMATCH)   ____________________________________________  EKG  ED ECG REPORT I, [place] N Rigo Letts, the attending physician, personally viewed and interpreted this ECG.   Date: 03/20/2019  EKG Time: 2:31 AM  Rate: 79  Rhythm: Normal sinus rhythm  Axis: Normal  Intervals: Normal  ST&T Change: None ___________________  RADIOLOGY I, [place] Jeraldin Fesler, personally viewed and evaluated these images (plain radiographs) as part of my medical decision making, as well as reviewing the written report by the radiologist.  ED MD interpretation: No acute cardiopulmonary abnormality noted on chest x-ray.  Official radiology report(s): Dg Chest Port 1 View  Result Date: 03/20/2019  CLINICAL DATA:  48 year old female status post blood transfusion at the [facility] recently. Shortness of breath, hematemesis. EXAM: PORTABLE CHEST 1 VIEW COMPARISON:  Portable chest 03/16/2019 and earlier. FINDINGS: Portable AP upright view at 0239 hours. Mildly lower lung volumes. Stable mediastinal contours, borderline to mild cardiomegaly. Visualized tracheal air column is within normal limits. No pneumothorax, pulmonary edema, pleural effusion or consolidation. No definite acute pulmonary opacity. No acute osseous abnormality identified. IMPRESSION: No acute cardiopulmonary abnormality. Electronically Signed   By: Genevie Ann M.D.   On: 03/20/2019 02:56     .Critical Care Performed by: Gregor Hams, MD Authorized by: Gregor Hams, MD   Critical care provider statement:    Critical care time (minutes):  45   Critical care time was exclusive of:  Separately billable procedures and treating other patients and teaching time   Critical care was necessary to treat or prevent imminent or life-threatening deterioration of the following conditions:  Circulatory failure (Hemorrhagic shock)   Critical care was time  spent personally by me on the following activities:  Development of treatment plan with patient or surrogate, discussions with consultants, evaluation of patient's response to treatment, examination of patient, obtaining history from patient or surrogate, ordering and performing treatments and interventions, ordering and review of laboratory studies, ordering and review of radiographic studies, pulse oximetry, re-evaluation of patient's condition and review of old charts   I assumed direction of critical care for this patient from another provider in my specialty: no       ____________________________________________   INITIAL IMPRESSION / MDM / [place] / ED COURSE  As part of my medical decision making, I reviewed the following data within the electronic MEDICAL RECORD NUMBER     48 year old female presenting with above-stated history and physical exam secondary to upper GI bleed with hypotension consistent with hemorrhagic shock.  Given presentation patient immediately received 2 L IV normal saline as well as 2 units of uncrossed matched packed red blood cells.  Concern for possible variceal versus gastric ulcer as etiology for the pati Ent's bleeding.  Patient discussed with Dr. Bonna Gains who was notified of all clinical findings thus far including patient's hemoglobin of 4.  Dr. Bonna Gains stated that she would notify Dr. Verl Blalock so that the patient would undergo endoscopy this morning.  Patient discussed with Dr. Marcille Blanco for hospital admission for further evaluation and management.  In addition the patient was given Protonix and octreotide bolus and infusions.   *Lacey May was evaluated in Emergency Department on 03/20/2019 for the symptoms described in the history of present illness. She was evaluated in the context of the global COVID-19 pandemic, which necessitated consideration that the patient might be at risk for infection with the SARS-CoV-2 virus that causes COVID-19.  Institutional protocols and algorithms that pertain to the evaluation of patients at risk for COVID-19 are in a state of rapid change based on information released by regulatory bodies including the CDC and federal and state organizations. These policies and algorithms were followed during the patient's care in the ED.  Some ED evaluations and interventions may be delayed as a result of limited staffing during the pandemic.*    ____________________________________________  FINAL CLINICAL IMPRESSION(S) / ED DIAGNOSES  Hemorrhagic shock Upper GI bleed  MEDICATIONS GIVEN DURING THIS VISIT:  Medications  sodium chloride 0.9 % bolus 1,000 mL (1,000 mLs Intravenous New Bag/Given 03/20/19 0239)    And  sodium chloride 0.9 % bolus 1,000 mL (1,000 mLs  Intravenous New Bag/Given 03/20/19 0238)    And  0.9 %  sodium chloride infusion (has no administration in time range)  pantoprazole (PROTONIX) 80 mg in sodium chloride 0.9 % 250 mL (0.32 mg/mL) infusion (8 mg/hr Intravenous New Bag/Given 03/20/19 0350)  pantoprazole (PROTONIX) injection 40 mg (has no administration in time range)  octreotide (SANDOSTATIN) 500 mcg in sodium chloride 0.9 % 250 mL (2 mcg/mL) infusion (50 mcg/hr Intravenous New Bag/Given 03/20/19 0407)  pantoprazole (PROTONIX) 80 mg in sodium chloride 0.9 % 100 mL IVPB (0 mg Intravenous Stopped 03/20/19 0348)  octreotide (SANDOSTATIN) injection 50 mcg (50 mcg Intravenous Given 03/20/19 0326)  0.9 %  sodium chloride infusion (0 mL/hr Intravenous Stopped 03/20/19 0427)  ondansetron (ZOFRAN) injection 4 mg (4 mg Intravenous Given 03/20/19 0344)     ED Discharge Orders    None       Note:  This document was prepared using Dragon voice recognition software and may include unintentional dictation errors.   Gregor Hams, MD 03/20/19 [phone]

## 2019-03-20 NOTE — Anesthesia Post-op Follow-up Note (Signed)
Anesthesia QCDR form completed.        

## 2019-03-21 LAB — COMPREHENSIVE METABOLIC PANEL
ALT: 14 U/L (ref 0–44)
AST: 18 U/L (ref 15–41)
Albumin: 2.4 g/dL — ABNORMAL LOW (ref 3.5–5.0)
Alkaline Phosphatase: 23 U/L — ABNORMAL LOW (ref 38–126)
Anion gap: 8 (ref 5–15)
BUN: 20 mg/dL (ref 6–20)
CO2: 23 mmol/L (ref 22–32)
Calcium: 8.4 mg/dL — ABNORMAL LOW (ref 8.9–10.3)
Chloride: 111 mmol/L (ref 98–111)
Creatinine, Ser: 0.82 mg/dL (ref 0.44–1.00)
GFR calc Af Amer: >60 mL/min (ref >60)
GFR calc non Af Amer: >60 mL/min (ref >60)
Glucose, Bld: 91 mg/dL (ref 70–99)
Potassium: 3.8 mmol/L (ref 3.5–5.1)
Sodium: 142 mmol/L (ref 135–145)
Total Bilirubin: 0.5 mg/dL (ref 0.3–1.2)
Total Protein: 5 g/dL — ABNORMAL LOW (ref 6.5–8.1)

## 2019-03-21 LAB — TYPE AND SCREEN
ABO/RH(D): A POS
Antibody Screen: NEGATIVE
Unit division: 0
Unit division: 0
Unit division: 0

## 2019-03-21 LAB — BPAM RBC
Blood Product Expiration Date: 202006242359
Blood Product Expiration Date: 202006242359
Blood Product Expiration Date: 202007012359
ISSUE DATE / TIME: 202006120259
ISSUE DATE / TIME: 202006120259
ISSUE DATE / TIME: 202006121618
Unit Type and Rh: 6200
Unit Type and Rh: 9500
Unit Type and Rh: 9500

## 2019-03-21 LAB — CBC
HCT: 25 % — ABNORMAL LOW (ref 36.0–46.0)
Hemoglobin: 8.3 g/dL — ABNORMAL LOW (ref 12.0–15.0)
MCH: 26 pg (ref 26.0–34.0)
MCHC: 33.2 g/dL (ref 30.0–36.0)
MCV: 78.4 fL — ABNORMAL LOW (ref 80.0–100.0)
Platelets: 135 10*3/uL — ABNORMAL LOW (ref 150–400)
RBC: 3.19 MIL/uL — ABNORMAL LOW (ref 3.87–5.11)
RDW: 17.5 % — ABNORMAL HIGH (ref 11.5–15.5)
WBC: 5.9 10*3/uL (ref 4.0–10.5)
nRBC: 0 % (ref 0.0–0.2)

## 2019-03-21 MED ORDER — NICOTINE 21 MG/24HR TD PT24: 21.0000 mg | MEDICATED_PATCH | Freq: Every day | TRANSDERMAL | 0 refills | Status: DC

## 2019-03-21 MED ORDER — PANTOPRAZOLE SODIUM 40 MG PO TBEC
40.0000 mg | DELAYED_RELEASE_TABLET | Freq: Two times a day (BID) | ORAL | Status: DC
  Administered 2019-03-21: 40 mg via ORAL
  Filled 2019-03-21: qty 1

## 2019-03-21 MED ORDER — PANTOPRAZOLE SODIUM 40 MG PO TBEC: 40.0000 mg | DELAYED_RELEASE_TABLET | Freq: Two times a day (BID) | ORAL | 0 refills | Status: DC

## 2019-03-21 MED ORDER — FERROUS SULFATE 325 (65 FE) MG PO TBEC: 325.0000 mg | DELAYED_RELEASE_TABLET | Freq: Two times a day (BID) | ORAL | 3 refills | Status: AC

## 2019-03-21 NOTE — Discharge Summary (Addendum)
[place] [facility] NAME: Lacey May    MR#:  [id number]  DATE OF BIRTH:  05-Dec-1970  DATE OF ADMISSION:  03/20/2019 ADMITTING PHYSICIAN: Harrie Foreman, MD  DATE OF DISCHARGE: 03/21/2019  PRIMARY CARE PHYSICIAN: [place]   ADMISSION DIAGNOSIS:  Dyspnea [R06.00] Hematemesis DISCHARGE DIAGNOSIS:  Active Problems:   Hematemesis Gastric ulcer Anemia Upper GI bleed Tobacco abuse COPD  SECONDARY DIAGNOSIS:   Past Medical History:  Diagnosis Date  . Asthma   . COPD (chronic obstructive pulmonary disease) ([place])   . Diabetes mellitus without complication ([place])   . Fibromyalgia   . Hypertension   . MS (multiple sclerosis) ([place])   . Restrictive airway disease   . Thyroid disease      ADMITTING HISTORY The patient with past medical history of hypertension, severe gastric reflux, COPD and history of diabetes presents to the emergency department complaining of hematemesis.  Notably, the patient was seen in the emergency department 4 days ago and found to be anemic.  She received a blood transfusion and hematology clinic 2 days ago.  She denies blood in her stool but admits to abdominal pain.  Today the patient also reports feeling nauseous.  She thinks she may have lost consciousness as she reports awaking from a nap to find blood in her hand.  Hemoglobin was found to be 4 g/dL in the emergency department.  Blood transfusion was initiated prior to the emergency department staff called the hospitalist service for admission.   HOSPITAL COURSE:  Patient presented to the emergency room with hemoglobin of 4.  She was transfused with PRBC IV.  Patient tolerated blood transfusions well.  Gastroenterology evaluated the patient during hospitalization.  Patient was put on IV Protonix drip.. Patient also received IV octreotide drip during the hospitalization.  Nicotine patch was placed for tobacco abuse and tobacco cessation  counseling was given.  Patient underwent upper endoscopy.  Nonobstructing nonbleeding gastric ulcer noted with a clean base.  NSAID etiology noted.  Patient was switched to oral Protonix and oral diet was introduced.  Patient was eager to go home.  Discussed with gastroenterology.  Patient will be discharged home on oral PPI and iron supplements.  Follow-up with gastroenterology in the clinic in 2 weeks.  Advised to avoid NSAIDs.  CONSULTS OBTAINED:  Treatment Team:  Jonathon Bellows, MD Lin Landsman, MD  DRUG ALLERGIES:   Allergies  Allergen Reactions  . Penicillins Anaphylaxis    Other reaction(s): SHORTNESS OF BREATH  . Clonidine Other (See Comments)    confusion  . Nefazodone Other (See Comments)    Tachacardia Other reaction(s): OTHER  . Benzoyl Peroxide Other (See Comments) and Rash    DISCHARGE MEDICATIONS:   Allergies as of 03/21/2019      Reactions   Penicillins Anaphylaxis   Other reaction(s): SHORTNESS OF BREATH   Clonidine Other (See Comments)   confusion   Nefazodone Other (See Comments)   Tachacardia Other reaction(s): OTHER   Benzoyl Peroxide Other (See Comments), Rash      Medication List    STOP taking these medications   beclomethasone 80 MCG/ACT inhaler Commonly known as: QVAR     TAKE these medications   albuterol 108 (90 Base) MCG/ACT inhaler Commonly known as: VENTOLIN HFA Frequency:PHARMDIR   Dosage:90   MCG  Instructions:  Note:2 puff q4-6 hrs prn shortness of breath or wheezingm use with spacer. Dose: 90MCG   cetirizine 10 MG tablet Commonly  known as: ZYRTEC Take 10 mg by mouth daily.   ferrous sulfate 325 (65 FE) MG EC tablet Take 1 tablet (325 mg total) by mouth 2 (two) times daily.   furosemide 20 MG tablet Commonly known as: LASIX Take 20 mg by mouth daily.   gabapentin 300 MG capsule Commonly known as: NEURONTIN Take 900 mg by mouth 3 (three) times daily.   Ipratropium-Albuterol 20-100 MCG/ACT Aers respimat Commonly known  as: COMBIVENT Inhale 1 puff into the lungs every 6 (six) hours.   mometasone 50 MCG/ACT nasal spray Commonly known as: NASONEX Place 2 sprays into the nose daily.   montelukast 10 MG tablet Commonly known as: SINGULAIR Take 10 mg by mouth at bedtime.   nicotine 21 mg/24hr patch Commonly known as: NICODERM CQ - dosed in mg/24 hours Place 1 patch (21 mg total) onto the skin daily. Start taking on: March 22, 2019   pantoprazole 40 MG tablet Commonly known as: Protonix Take 1 tablet (40 mg total) by mouth 2 (two) times daily for 30 days.   promethazine 25 MG tablet Commonly known as: PHENERGAN Take 25 mg by mouth every 6 (six) hours as needed.       Today  Patient seen and evaluated today No new episodes of vomiting of blood Tolerating diet okay Hemoglobin stable Hemodynamically stable Be discharged home on proton pump inhibitor  VITAL SIGNS:  Blood pressure 136/87, pulse (!) 56, temperature (!) 97.4 F (36.3 C), temperature source Oral, resp. rate 18, height 5\' 3"  (1.6 m), weight 51.3 kg, SpO2 100 %.  I/O:    Intake/Output Summary (Last 24 hours) at 03/21/2019 1032 Last data filed at 03/21/2019 1010 Gross per 24 hour  Intake 1821.79 ml  Output 1150 ml  Net 671.79 ml    PHYSICAL EXAMINATION:  Physical Exam  GENERAL:  48 y.o.-year-old patient lying in the bed with no acute distress.  LUNGS: Normal breath sounds bilaterally, no wheezing, rales,rhonchi or crepitation. No use of accessory muscles of respiration.  CARDIOVASCULAR: S1, S2 normal. No murmurs, rubs, or gallops.  ABDOMEN: Soft, non-tender, non-distended. Bowel sounds present. No organomegaly or mass.  NEUROLOGIC: Moves all 4 extremities. PSYCHIATRIC: The patient is alert and oriented x 3.  SKIN: No obvious rash, lesion, or ulcer.   DATA REVIEW:   CBC Recent Labs  Lab 03/21/19 0511  WBC 5.9  HGB 8.3*  HCT 25.0*  PLT 135*    Chemistries  Recent Labs  Lab 03/21/19 0511  NA 142  K 3.8  CL 111   CO2 23  GLUCOSE 91  BUN 20  CREATININE 0.82  CALCIUM 8.4*  AST 18  ALT 14  ALKPHOS 23*  BILITOT 0.5    Cardiac Enzymes Recent Labs  Lab 03/16/19 2332  TROPONINI <0.03    Microbiology Results  Results for orders placed or performed during the hospital encounter of 03/20/19  MRSA PCR Screening     Status: None   Collection Time: 03/20/19  5:59 AM   Specimen: Nasopharyngeal  Result Value Ref Range Status   MRSA by PCR NEGATIVE NEGATIVE Final    Comment:        The GeneXpert MRSA Assay (FDA approved for NASAL specimens only), is one component of a comprehensive MRSA colonization surveillance program. It is not intended to diagnose MRSA infection nor to guide or monitor treatment for MRSA infections. Performed at [facility], [address], [place], [place] [address]     RADIOLOGY:  Dg Chest [facility] [address]  Result Date: 03/20/2019 CLINICAL DATA:  48 year old female status post blood transfusion at the [facility] recently. Shortness of breath, hematemesis. EXAM: PORTABLE CHEST 1 VIEW COMPARISON:  Portable chest 03/16/2019 and earlier. FINDINGS: Portable AP upright view at 0239 hours. Mildly lower lung volumes. Stable mediastinal contours, borderline to mild cardiomegaly. Visualized tracheal air column is within normal limits. No pneumothorax, pulmonary edema, pleural effusion or consolidation. No definite acute pulmonary opacity. No acute osseous abnormality identified. IMPRESSION: No acute cardiopulmonary abnormality. Electronically Signed   By: Genevie Ann M.D.   On: 03/20/2019 02:56    Follow up with PCP in 1 week.  Management plans discussed with the patient, family and they are in agreement.  CODE STATUS: Full code    Code Status Orders  (From admission, onward)         Start     Ordered   03/20/19 0450  Full code  Continuous     03/20/19 0449        Code Status History    This patient has a current code status but no historical code status.    Advance Care Planning Activity      TOTAL TIME TAKING CARE OF THIS PATIENT ON DAY OF DISCHARGE: more than 35 minutes.   Saundra Shelling M.D on 03/21/2019 at 10:32 AM  Between 7am to 6pm - Pager - [phone]  After 6pm go to [email or web address] - password [email or web address]  Office  [phone]  CC: Primary care physician; [facility]  Note: This dictation was prepared with Dragon dictation along with smaller phrase technology. Any transcriptional errors that result from this process are unintentional.

## 2019-03-21 NOTE — Progress Notes (Signed)
Advanced care plan. Purpose of the Encounter: CODE STATUS Parties in Attendance: Patient Patient's Decision Capacity: Good Subjective/Patient's story: The patient with past medical history of hypertension, severe gastric reflux, COPD and history of diabetes presents to the emergency department complaining of hematemesis. Notably, the patient was seen in the emergency department 4 days ago and found to be anemic. She received a blood transfusion and hematology clinic 2 days ago. She denies blood in her stool but admits to abdominal pain. Today the patient also reports feeling nauseous. She thinks she may have lost consciousness as she reports awaking from a nap to find blood in her hand. Hemoglobin was found to be 4 g/dL in the emergency department. Blood transfusion was initiated prior to the emergency department staff called the hospitalist service for admission. Objective/Medical story Patient needs gastroenterology evaluation.  Needs PRBC transfusion.  Patient will need IV Protonix drip and octreotide drips.  Needs iron supplementation and GI work-up Goals of care determination:  Advance care directives goals of care treatment plan discussed Patient wants everything done which includes CPR, intubation ventilator the need CODE STATUS: Full code Time spent discussing advanced care planning: 16 minutes

## 2019-03-21 NOTE — Progress Notes (Signed)
Pt is being discharged home. Discharge papers given and explained to pt. Pt verbalized understanding. Meds and f/u appointments reviewed. Rx to be picked up from pharmacy. Pt educated about eating 2 gram sodium diet. Verbalized understanding. Awaiting transportation.

## 2019-03-23 ENCOUNTER — Other Ambulatory Visit: Payer: Self-pay | Admitting: Oncology

## 2019-03-23 ENCOUNTER — Inpatient Hospital Stay: Payer: Medicaid Other

## 2019-03-23 ENCOUNTER — Other Ambulatory Visit: Payer: Self-pay | Admitting: *Deleted

## 2019-03-23 ENCOUNTER — Encounter: Payer: Self-pay | Admitting: Oncology

## 2019-03-23 ENCOUNTER — Other Ambulatory Visit: Payer: Self-pay

## 2019-03-23 VITALS — BP 123/79 | HR 61 | Temp 96.0°F | Resp 18

## 2019-03-23 DIAGNOSIS — D509 Iron deficiency anemia, unspecified: Secondary | ICD-10-CM

## 2019-03-23 LAB — CBC WITH DIFFERENTIAL/PLATELET
Abs Immature Granulocytes: 0.02 10*3/uL (ref 0.00–0.07)
Basophils Absolute: 0 10*3/uL (ref 0.0–0.1)
Basophils Relative: 1 %
Eosinophils Absolute: 0.3 10*3/uL (ref 0.0–0.5)
Eosinophils Relative: 5 %
HCT: 25.9 % — ABNORMAL LOW (ref 36.0–46.0)
Hemoglobin: 8.4 g/dL — ABNORMAL LOW (ref 12.0–15.0)
Immature Granulocytes: 0 %
Lymphocytes Relative: 33 %
Lymphs Abs: 1.7 10*3/uL (ref 0.7–4.0)
MCH: 26.9 pg (ref 26.0–34.0)
MCHC: 32.4 g/dL (ref 30.0–36.0)
MCV: 83 fL (ref 80.0–100.0)
Monocytes Absolute: 0.5 10*3/uL (ref 0.1–1.0)
Monocytes Relative: 9 %
Neutro Abs: 2.7 10*3/uL (ref 1.7–7.7)
Neutrophils Relative %: 52 %
Platelets: 137 10*3/uL — ABNORMAL LOW (ref 150–400)
RBC: 3.12 MIL/uL — ABNORMAL LOW (ref 3.87–5.11)
RDW: 21 % — ABNORMAL HIGH (ref 11.5–15.5)
Smear Review: NORMAL
WBC: 5.1 10*3/uL (ref 4.0–10.5)
nRBC: 0 % (ref 0.0–0.2)

## 2019-03-23 LAB — PREPARE RBC (CROSSMATCH)

## 2019-03-23 LAB — HEMOGLOBIN A1C
Hgb A1c MFr Bld: 6.6 % — ABNORMAL HIGH (ref 4.8–5.6)
Mean Plasma Glucose: 142.72 mg/dL

## 2019-03-23 MED ORDER — CYANOCOBALAMIN 1000 MCG/ML IJ SOLN
1000.0000 ug | INTRAMUSCULAR | Status: DC
  Administered 2019-03-23: 1000 ug via INTRAMUSCULAR
  Filled 2019-03-23: qty 1

## 2019-03-23 MED ORDER — SODIUM CHLORIDE 0.9 % IV SOLN
Freq: Once | INTRAVENOUS | Status: AC
  Administered 2019-03-23: 14:00:00 via INTRAVENOUS
  Filled 2019-03-23: qty 250

## 2019-03-23 MED ORDER — SODIUM CHLORIDE 0.9 % IV SOLN
510.0000 mg | Freq: Once | INTRAVENOUS | Status: AC
  Administered 2019-03-23: 510 mg via INTRAVENOUS
  Filled 2019-03-23: qty 17

## 2019-03-23 NOTE — Progress Notes (Signed)
Pt tolerated treatment well. Pt and VS stable at discharge.  

## 2019-03-23 NOTE — Progress Notes (Signed)
I connected with Lacey May on 03/23/19 at 10:00 AM EDT by video enabled telemedicine visit and verified that I am speaking with the correct person using two identifiers.   I discussed the limitations, risks, security and privacy concerns of performing an evaluation and management service by telemedicine and the availability of in-person appointments. I also discussed with the patient that there may be a patient responsible charge related to this service. The patient expressed understanding and agreed to proceed.  Other persons participating in the visit and their role in the encounter:  none  Patient's location:  home Provider's location:  home  Reason for referral: iron deficiency anemia Referring provider: ER  History of present illness: Patient is a 48 yr old female who presented to the ER on 03/17/19 with symptoms of fatigue, generalized weakness. She has been having chronic abdominal pain and has been taking BC powder twice daily. Her H/H was found to be 6.7/23.2. she had child care responsibilities and did not wait to get blood transfusion. She was then seen by pcp who recommended GI work up which she refused.  Today patient states she has lost about 30 pounds in the last 6 months. Denies any blood in stool or urine. Denies dark melanotic stools. She used to drink alcohol regularly but not for the last 2 years    Review of Systems  Constitutional: Positive for malaise/fatigue and weight loss. Negative for chills and fever.  HENT: Negative for congestion, ear discharge and nosebleeds.   Eyes: Negative for blurred vision.  Respiratory: Negative for cough, hemoptysis, sputum production, shortness of breath and wheezing.   Cardiovascular: Negative for chest pain, palpitations, orthopnea and claudication.  Gastrointestinal: Negative for abdominal pain, blood in stool, constipation, diarrhea, heartburn, melena, nausea and vomiting.  Genitourinary: Negative for dysuria, flank pain,  frequency, hematuria and urgency.  Musculoskeletal: Negative for back pain, joint pain and myalgias.  Skin: Negative for rash.  Neurological: Negative for dizziness, tingling, focal weakness, seizures, weakness and headaches.  Endo/Heme/Allergies: Does not bruise/bleed easily.  Psychiatric/Behavioral: Negative for depression and suicidal ideas. The patient does not have insomnia.     Allergies  Allergen Reactions  . Penicillins Anaphylaxis    Other reaction(s): SHORTNESS OF BREATH  . Clonidine Other (See Comments)    confusion  . Nefazodone Other (See Comments)    Tachacardia Other reaction(s): OTHER  . Benzoyl Peroxide Other (See Comments) and Rash    Past Medical History:  Diagnosis Date  . Asthma   . COPD (chronic obstructive pulmonary disease) ([place])   . Diabetes mellitus without complication ([place])   . Fibromyalgia   . Hypertension   . MS (multiple sclerosis) ([place])   . Restrictive airway disease   . Thyroid disease     Past Surgical History:  Procedure Laterality Date  . ABDOMINAL HYSTERECTOMY    . CESAREAN SECTION    . ESOPHAGOGASTRODUODENOSCOPY (EGD) WITH PROPOFOL N/A 03/20/2019   Procedure: ESOPHAGOGASTRODUODENOSCOPY (EGD) WITH PROPOFOL;  Surgeon: Jonathon Bellows, MD;  Location: [facility] ENDOSCOPY;  Service: Gastroenterology;  Laterality: N/A;  . NISSEN FUNDOPLICATION      Social History   Socioeconomic History  . Marital status: Married    Spouse name: Not on file  . Number of children: Not on file  . Years of education: Not on file  . Highest education level: Not on file  Occupational History  . Not on file  Social Needs  . Financial resource strain: Not on file  . Food insecurity  Worry: Not on file    Inability: Not on file  . Transportation needs    Medical: Not on file    Non-medical: Not on file  Tobacco Use  . Smoking status: Current Every Day Smoker    Packs/day: 0.50    Types: Cigarettes  . Smokeless tobacco: Never Used  Substance and Sexual  Activity  . Alcohol use: No    Alcohol/week: 0.0 standard drinks  . Drug use: No  . Sexual activity: Not Currently  Lifestyle  . Physical activity    Days per week: Not on file    Minutes per session: Not on file  . Stress: Not on file  Relationships  . Social Herbalist on phone: Not on file    Gets together: Not on file    Attends religious service: Not on file    Active member of club or organization: Not on file    Attends meetings of clubs or organizations: Not on file    Relationship status: Not on file  . Intimate partner violence    Fear of current or ex partner: Not on file    Emotionally abused: Not on file    Physically abused: Not on file    Forced sexual activity: Not on file  Other Topics Concern  . Not on file  Social History Narrative  . Not on file    Family History  Adopted: Yes  Problem Relation Age of Onset  . Breast cancer Mother   . Heart failure Father   . Hypertension Father   . Lupus Brother   . Diabetes Brother   . Breast cancer Maternal Aunt   . Diabetes Maternal Grandmother   . Diabetes Maternal Grandfather   . Lung cancer Paternal Grandfather      Current Outpatient Medications:  .  albuterol (PROVENTIL HFA;VENTOLIN HFA) 108 (90 BASE) MCG/ACT inhaler, Frequency:PHARMDIR   Dosage:90   MCG  Instructions:  Note:2 puff q4-6 hrs prn shortness of breath or wheezingm use with spacer. Dose: 90MCG, Disp: , Rfl:  .  gabapentin (NEURONTIN) 300 MG capsule, Take 900 mg by mouth 3 (three) times daily. , Disp: , Rfl:  .  cetirizine (ZYRTEC) 10 MG tablet, Take 10 mg by mouth daily., Disp: , Rfl:  .  ferrous sulfate 325 (65 FE) MG EC tablet, Take 1 tablet (325 mg total) by mouth 2 (two) times daily., Disp: 60 tablet, Rfl: 3 .  furosemide (LASIX) 20 MG tablet, Take 20 mg by mouth daily., Disp: , Rfl:  .  Ipratropium-Albuterol (COMBIVENT) 20-100 MCG/ACT AERS respimat, Inhale 1 puff into the lungs every 6 (six) hours., Disp: , Rfl:  .  mometasone  (NASONEX) 50 MCG/ACT nasal spray, Place 2 sprays into the nose daily., Disp: , Rfl:  .  montelukast (SINGULAIR) 10 MG tablet, Take 10 mg by mouth at bedtime., Disp: , Rfl:  .  nicotine (NICODERM CQ - DOSED IN MG/24 HOURS) 21 mg/24hr patch, Place 1 patch (21 mg total) onto the skin daily., Disp: 28 patch, Rfl: 0 .  pantoprazole (PROTONIX) 40 MG tablet, Take 1 tablet (40 mg total) by mouth 2 (two) times daily for 30 days., Disp: 60 tablet, Rfl: 0 .  promethazine (PHENERGAN) 25 MG tablet, Take 25 mg by mouth every 6 (six) hours as needed. , Disp: , Rfl:   Dg Chest Port 1 View  Result Date: 03/20/2019 CLINICAL DATA:  48 year old female status post blood transfusion at the [facility] recently. Shortness of  breath, hematemesis. EXAM: PORTABLE CHEST 1 VIEW COMPARISON:  Portable chest 03/16/2019 and earlier. FINDINGS: Portable AP upright view at 0239 hours. Mildly lower lung volumes. Stable mediastinal contours, borderline to mild cardiomegaly. Visualized tracheal air column is within normal limits. No pneumothorax, pulmonary edema, pleural effusion or consolidation. No definite acute pulmonary opacity. No acute osseous abnormality identified. IMPRESSION: No acute cardiopulmonary abnormality. Electronically Signed   By: Genevie Ann M.D.   On: 03/20/2019 02:56   Dg Chest Portable 1 View  Result Date: 03/17/2019 CLINICAL DATA:  48 year old female with chest pressure, lower extremity swelling, shortness of breath. Smoker. EXAM: PORTABLE CHEST 1 VIEW COMPARISON:  01/28/2019 portable chest and earlier. FINDINGS: Portable AP upright view at 2334 hours. Stable lung volumes. Resolved patchy opacity at the right lung base since April. Stable cardiac size at the upper limits of normal. Other mediastinal contours are within normal limits. Mild bilateral interstitial markings are stable with no pneumothorax, pulmonary edema, pleural effusion or acute pulmonary opacity. Negative visible bowel gas pattern. No acute osseous  abnormality identified. IMPRESSION: No acute cardiopulmonary abnormality. Electronically Signed   By: Genevie Ann M.D.   On: 03/17/2019 00:04    No images are attached to the encounter.   CMP Latest Ref Rng & Units 03/21/2019  Glucose 70 - 99 mg/dL 91  BUN 6 - 20 mg/dL 20  Creatinine 0.44 - 1.00 mg/dL 0.82  Sodium 135 - 145 mmol/L 142  Potassium 3.5 - 5.1 mmol/L 3.8  Chloride 98 - 111 mmol/L 111  CO2 22 - 32 mmol/L 23  Calcium 8.9 - 10.3 mg/dL 8.4(L)  Total Protein 6.5 - 8.1 g/dL 5.0(L)  Total Bilirubin 0.3 - 1.2 mg/dL 0.5  Alkaline Phos 38 - 126 U/L 23(L)  AST 15 - 41 U/L 18  ALT 0 - 44 U/L 14   CBC Latest Ref Rng & Units 03/21/2019  WBC 4.0 - 10.5 K/uL 5.9  Hemoglobin 12.0 - 15.0 g/dL 8.3(L)  Hematocrit 36.0 - 46.0 % 25.0(L)  Platelets 150 - 400 K/uL 135(L)     Observation/objective:appears older than stated age. Breathing non labored  Assessment and plan:patient is a 48 yr old female referred for severe iron deficiency anemia  Patients symptoms are concerning for possible GI bleeding given her h/o chronic BC powder use. Her hb has gradually declined over last 2 months. I strongly recommended GI work up and she has agreed. I did get in touch with Dr. Vicente Males who is on call today to arrange for visit asap.  I recommend checking cbc ferritin and iron studies and b12 at this time. Recommend 2 doses of feraheme 510mg  IV X2. Discussed risks and benefits of IV iron including all but not limited to headache, leg swelling and possible risk of infusion reaction. Patient understands and agrees to proceed as planned.  If he hb is <7- will give her blood transfusion  Follow-up instructions:feraheme X2. Repeat cbc in 1 week, hold tube for possible transfusion.  rtc in 2 months with cbc ferritin and iron studies  I discussed the assessment and treatment plan with the patient. The patient was provided an opportunity to ask questions and all were answered. The patient agreed with the plan and  demonstrated an understanding of the instructions.   The patient was advised to call back or seek an in-person evaluation if the symptoms worsen or if the condition fails to improve as anticipated.    Visit Diagnosis: 1. Iron deficiency anemia, unspecified iron deficiency anemia type  Dr. Randa Evens, MD, MPH [place] [facility] [phone] 03/23/2019 2:07 PM

## 2019-03-26 ENCOUNTER — Other Ambulatory Visit: Payer: Self-pay

## 2019-03-26 ENCOUNTER — Ambulatory Visit: Payer: Self-pay

## 2019-03-27 ENCOUNTER — Other Ambulatory Visit: Payer: Self-pay

## 2019-03-30 ENCOUNTER — Other Ambulatory Visit: Payer: Self-pay

## 2019-03-30 ENCOUNTER — Encounter: Payer: Self-pay | Admitting: Gastroenterology

## 2019-03-30 ENCOUNTER — Inpatient Hospital Stay: Payer: Medicaid Other

## 2019-03-30 ENCOUNTER — Ambulatory Visit (INDEPENDENT_AMBULATORY_CARE_PROVIDER_SITE_OTHER): Payer: Self-pay | Admitting: Gastroenterology

## 2019-03-30 VITALS — BP 112/65 | HR 78 | Temp 99.0°F | Ht 64.0 in | Wt 100.8 lb

## 2019-03-30 VITALS — BP 116/66 | HR 60 | Temp 98.8°F | Resp 18

## 2019-03-30 DIAGNOSIS — K254 Chronic or unspecified gastric ulcer with hemorrhage: Secondary | ICD-10-CM

## 2019-03-30 DIAGNOSIS — D509 Iron deficiency anemia, unspecified: Secondary | ICD-10-CM

## 2019-03-30 MED ORDER — SODIUM CHLORIDE 0.9 % IV SOLN
Freq: Once | INTRAVENOUS | Status: AC
  Administered 2019-03-30: 11:00:00 via INTRAVENOUS
  Filled 2019-03-30: qty 250

## 2019-03-30 MED ORDER — SODIUM CHLORIDE 0.9 % IV SOLN
510.0000 mg | Freq: Once | INTRAVENOUS | Status: AC
  Administered 2019-03-30: 510 mg via INTRAVENOUS
  Filled 2019-03-30: qty 17

## 2019-03-30 NOTE — Progress Notes (Signed)
Jonathon Bellows MD, MRCP(U.K) [address]  [place]  [place], [place] [address]  Main: [phone]  Fax: [phone]   Primary Care Physician: [facility]  Primary Gastroenterologist:  Dr. Jonathon Bellows   She is here today to see me as a hospital follow-up.  HPI: OLETA GUNNOE is a 48 y.o. female was seen by myself when admitted to the hospital on 03/20/2019.  She was found to incidentally have a hemoglobin of 6.7 when checked by her PCPs office and hence was admitted to the hospital.  A prior CBC we had 3 years back was normal on admission ferritin of 3.  No history of alcohol usage in the past 2 years.  Longstanding utilization of BC powder.  B12 is borderline low at 227.  She received a blood transfusion.  I performed an upper endoscopy on 03/20/2019.  I saw a large quantity of blood in the stomach which obscured my view.One non-obstructing non-bleeding cratered gastric ulcer of significant severity with a clean ulcer base (Forrest Class III) was foundin the gastric fundus. The lesion was 10 mm in largest dimension. There is no evidence of perforation .  Visualization was limited and the plan was to keep her in hospital and watch her closely.  She was discharged the next day.  Since discharge she says   No blood in her stool. She has stopped all her BC powder. Taking nexium daily . 40 BID.   Current Outpatient Medications  Medication Sig Dispense Refill  . albuterol (PROVENTIL HFA;VENTOLIN HFA) 108 (90 BASE) MCG/ACT inhaler Frequency:PHARMDIR   Dosage:90   MCG  Instructions:  Note:2 puff q4-6 hrs prn shortness of breath or wheezingm use with spacer. Dose: 90MCG    . cetirizine (ZYRTEC) 10 MG tablet Take 10 mg by mouth daily.    . ferrous sulfate 325 (65 FE) MG EC tablet Take 1 tablet (325 mg total) by mouth 2 (two) times daily. 60 tablet 3  . furosemide (LASIX) 20 MG tablet Take 20 mg by mouth daily.    Marland Kitchen gabapentin (NEURONTIN) 300 MG capsule Take 900 mg by mouth  3 (three) times daily.     . Ipratropium-Albuterol (COMBIVENT) 20-100 MCG/ACT AERS respimat Inhale 1 puff into the lungs every 6 (six) hours.    . mometasone (NASONEX) 50 MCG/ACT nasal spray Place 2 sprays into the nose daily.    . montelukast (SINGULAIR) 10 MG tablet Take 10 mg by mouth at bedtime.    . nicotine (NICODERM CQ - DOSED IN MG/24 HOURS) 21 mg/24hr patch Place 1 patch (21 mg total) onto the skin daily. 28 patch 0  . pantoprazole (PROTONIX) 40 MG tablet Take 1 tablet (40 mg total) by mouth 2 (two) times daily for 30 days. 60 tablet 0  . promethazine (PHENERGAN) 25 MG tablet Take 25 mg by mouth every 6 (six) hours as needed.      No current facility-administered medications for this visit.     Allergies as of 03/30/2019 - Review Complete 03/20/2019  Allergen Reaction Noted  . Penicillins Anaphylaxis 02/02/2015  . Clonidine Other (See Comments) 05/05/2015  . Nefazodone Other (See Comments) 02/02/2015  . Benzoyl peroxide Other (See Comments) and Rash 02/02/2015    ROS:  General: Negative for anorexia, weight loss, fever, chills, fatigue, weakness. ENT: Negative for hoarseness, difficulty swallowing , nasal congestion. CV: Negative for chest pain, angina, palpitations, dyspnea on exertion, peripheral edema.  Respiratory: Negative for dyspnea at rest, dyspnea on exertion, cough, sputum,  wheezing.  GI: See history of present illness. GU:  Negative for dysuria, hematuria, urinary incontinence, urinary frequency, nocturnal urination.  Endo: Negative for unusual weight change.    Physical Examination:   There were no vitals taken for this visit.  General: Well-nourished, well-developed in no acute distress.  Eyes: No icterus. Conjunctivae pink. Mouth: Oropharyngeal mucosa moist and pink , no lesions erythema or exudate. Lungs: Clear to auscultation bilaterally. Non-labored. Heart: Regular rate and rhythm, no murmurs rubs or gallops.  Abdomen: Bowel sounds are normal,  nontender, nondistended, no hepatosplenomegaly or masses, no abdominal bruits or hernia , no rebound or guarding.   Extremities: No lower extremity edema. No clubbing or deformities. Neuro: Alert and oriented x 3.  Grossly intact. Skin: Warm and dry, no jaundice.   Psych: Alert and cooperative, normal mood and affect.   Imaging Studies: Dg Chest Port 1 View  Result Date: 03/20/2019 CLINICAL DATA:  48 year old female status post blood transfusion at the [facility] recently. Shortness of breath, hematemesis. EXAM: PORTABLE CHEST 1 VIEW COMPARISON:  Portable chest 03/16/2019 and earlier. FINDINGS: Portable AP upright view at 0239 hours. Mildly lower lung volumes. Stable mediastinal contours, borderline to mild cardiomegaly. Visualized tracheal air column is within normal limits. No pneumothorax, pulmonary edema, pleural effusion or consolidation. No definite acute pulmonary opacity. No acute osseous abnormality identified. IMPRESSION: No acute cardiopulmonary abnormality. Electronically Signed   By: Genevie Ann M.D.   On: 03/20/2019 02:56   Dg Chest Portable 1 View  Result Date: 03/17/2019 CLINICAL DATA:  48 year old female with chest pressure, lower extremity swelling, shortness of breath. Smoker. EXAM: PORTABLE CHEST 1 VIEW COMPARISON:  01/28/2019 portable chest and earlier. FINDINGS: Portable AP upright view at 2334 hours. Stable lung volumes. Resolved patchy opacity at the right lung base since April. Stable cardiac size at the upper limits of normal. Other mediastinal contours are within normal limits. Mild bilateral interstitial markings are stable with no pneumothorax, pulmonary edema, pleural effusion or acute pulmonary opacity. Negative visible bowel gas pattern. No acute osseous abnormality identified. IMPRESSION: No acute cardiopulmonary abnormality. Electronically Signed   By: Genevie Ann M.D.   On: 03/17/2019 00:04    Assessment and Plan:   EMELIN DASCENZO is a 48 y.o. y/o female admitted on  03/20/2019 with an incidentally found hemoglobin of 4 g.  Upper endoscopy revealed a large gastric ulcer with stigmata of recent bleed.  Stomach was full of old clotted blood.  Limited visualization.  Likely from chronic NSAID use.  She was placed on a PPI and discharged the next day.  She is found to have iron deficiency and B12 deficiency on lab work.  She follows with him otology for replacement. Plan 1.  Suggest omeprazole 40 mg twice daily. 2.  H. pylori breath test. 3.  EGD in 8 to 10 weeks to check for healing of the ulcer and examined the remaining part of her stomach. 4.  Depending on her response to treatment will determine if she requires a colonoscopy and small bowel evaluation. 5. F/u with Dr Janese Banks for IV iron.   I have discussed alternative options, risks & benefits,  which include, but are not limited to, bleeding, infection, perforation,respiratory complication & drug reaction.  The patient agrees with this plan & written consent will be obtained.        Dr Jonathon Bellows  MD,MRCP [facility]) Follow up in 3 months

## 2019-03-31 ENCOUNTER — Encounter: Payer: Self-pay | Admitting: Gastroenterology

## 2019-03-31 ENCOUNTER — Encounter: Payer: Self-pay | Admitting: Pharmacy Technician

## 2019-03-31 LAB — CBC WITH DIFFERENTIAL/PLATELET
Basophils Absolute: 0.1 10*3/uL (ref 0.0–0.2)
Basos: 1 %
EOS (ABSOLUTE): 0.3 10*3/uL (ref 0.0–0.4)
Eos: 3 %
Hematocrit: 29.2 % — ABNORMAL LOW (ref 34.0–46.6)
Hemoglobin: 9.1 g/dL — ABNORMAL LOW (ref 11.1–15.9)
Immature Grans (Abs): 0 10*3/uL (ref 0.0–0.1)
Immature Granulocytes: 0 %
Lymphocytes Absolute: 2.3 10*3/uL (ref 0.7–3.1)
Lymphs: 29 %
MCH: 27.7 pg (ref 26.6–33.0)
MCHC: 31.2 g/dL — ABNORMAL LOW (ref 31.5–35.7)
MCV: 89 fL (ref 79–97)
Monocytes Absolute: 0.7 10*3/uL (ref 0.1–0.9)
Monocytes: 9 %
Neutrophils Absolute: 4.5 10*3/uL (ref 1.4–7.0)
Neutrophils: 58 %
Platelets: 212 10*3/uL (ref 150–450)
RBC: 3.29 x10E6/uL — ABNORMAL LOW (ref 3.77–5.28)
RDW: 20.5 % — ABNORMAL HIGH (ref 11.7–15.4)
WBC: 7.9 10*3/uL (ref 3.4–10.8)

## 2019-03-31 LAB — H. PYLORI BREATH TEST: H pylori Breath Test: NEGATIVE

## 2019-03-31 NOTE — Progress Notes (Signed)
Patient has been approved for drug assistance by [employer] for Feraheme. The enrollment period is from 03/19/19-03/17/20 based on self pay. First DOS covered is 03/19/19.

## 2019-04-20 ENCOUNTER — Inpatient Hospital Stay: Payer: MEDICAID

## 2019-04-21 ENCOUNTER — Inpatient Hospital Stay: Payer: MEDICAID

## 2019-04-23 ENCOUNTER — Other Ambulatory Visit: Payer: Self-pay

## 2019-04-23 ENCOUNTER — Inpatient Hospital Stay: Payer: Self-pay | Attending: Oncology

## 2019-04-23 ENCOUNTER — Inpatient Hospital Stay: Payer: Self-pay

## 2019-04-23 VITALS — BP 114/63 | HR 64 | Temp 98.3°F | Resp 18

## 2019-04-23 DIAGNOSIS — D509 Iron deficiency anemia, unspecified: Secondary | ICD-10-CM

## 2019-04-23 LAB — IRON AND TIBC
Iron: 110 ug/dL (ref 28–170)
Saturation Ratios: 44 % — ABNORMAL HIGH (ref 10.4–31.8)
TIBC: 249 ug/dL — ABNORMAL LOW (ref 250–450)
UIBC: 139 ug/dL

## 2019-04-23 LAB — CBC WITH DIFFERENTIAL/PLATELET
Abs Immature Granulocytes: 0.01 K/uL (ref 0.00–0.07)
Basophils Absolute: 0 K/uL (ref 0.0–0.1)
Basophils Relative: 1 %
Eosinophils Absolute: 0.2 K/uL (ref 0.0–0.5)
Eosinophils Relative: 3 %
HCT: 40.4 % (ref 36.0–46.0)
Hemoglobin: 12.6 g/dL (ref 12.0–15.0)
Immature Granulocytes: 0 %
Lymphocytes Relative: 39 %
Lymphs Abs: 2.1 K/uL (ref 0.7–4.0)
MCH: 29.1 pg (ref 26.0–34.0)
MCHC: 31.2 g/dL (ref 30.0–36.0)
MCV: 93.3 fL (ref 80.0–100.0)
Monocytes Absolute: 0.6 K/uL (ref 0.1–1.0)
Monocytes Relative: 12 %
Neutro Abs: 2.5 K/uL (ref 1.7–7.7)
Neutrophils Relative %: 45 %
Platelets: 137 K/uL — ABNORMAL LOW (ref 150–400)
RBC: 4.33 MIL/uL (ref 3.87–5.11)
RDW: 19.6 % — ABNORMAL HIGH (ref 11.5–15.5)
Smear Review: DECREASED
WBC Morphology: ABNORMAL
WBC: 5.5 K/uL (ref 4.0–10.5)
nRBC: 0 % (ref 0.0–0.2)

## 2019-04-23 LAB — FERRITIN: Ferritin: 110 ng/mL (ref 11–307)

## 2019-04-23 MED ORDER — SODIUM CHLORIDE 0.9 % IV SOLN
510.0000 mg | Freq: Once | INTRAVENOUS | Status: AC
  Administered 2019-04-23: 510 mg via INTRAVENOUS
  Filled 2019-04-23: qty 17

## 2019-04-23 MED ORDER — SODIUM CHLORIDE 0.9 % IV SOLN
Freq: Once | INTRAVENOUS | Status: AC
  Administered 2019-04-23: 14:00:00 via INTRAVENOUS
  Filled 2019-04-23: qty 250

## 2019-05-14 ENCOUNTER — Inpatient Hospital Stay (HOSPITAL_BASED_OUTPATIENT_CLINIC_OR_DEPARTMENT_OTHER): Payer: Self-pay | Admitting: Oncology

## 2019-05-14 ENCOUNTER — Other Ambulatory Visit: Payer: Self-pay

## 2019-05-14 ENCOUNTER — Inpatient Hospital Stay: Payer: Self-pay | Attending: Oncology

## 2019-05-14 ENCOUNTER — Other Ambulatory Visit: Admission: RE | Admit: 2019-05-14 | Payer: MEDICAID | Source: Ambulatory Visit

## 2019-05-14 VITALS — BP 121/80 | HR 82 | Temp 97.8°F | Resp 18 | Wt 103.3 lb

## 2019-05-14 DIAGNOSIS — D509 Iron deficiency anemia, unspecified: Secondary | ICD-10-CM | POA: Insufficient documentation

## 2019-05-14 DIAGNOSIS — Z720 Tobacco use: Secondary | ICD-10-CM | POA: Insufficient documentation

## 2019-05-14 LAB — IRON AND TIBC
Iron: 88 ug/dL (ref 28–170)
Saturation Ratios: 39 % — ABNORMAL HIGH (ref 10.4–31.8)
TIBC: 229 ug/dL — ABNORMAL LOW (ref 250–450)
UIBC: 141 ug/dL

## 2019-05-14 LAB — CBC WITH DIFFERENTIAL/PLATELET
Abs Immature Granulocytes: 0.01 10*3/uL (ref 0.00–0.07)
Basophils Absolute: 0 10*3/uL (ref 0.0–0.1)
Basophils Relative: 0 %
Eosinophils Absolute: 0.1 10*3/uL (ref 0.0–0.5)
Eosinophils Relative: 1 %
HCT: 44.4 % (ref 36.0–46.0)
Hemoglobin: 14 g/dL (ref 12.0–15.0)
Immature Granulocytes: 0 %
Lymphocytes Relative: 21 %
Lymphs Abs: 1.6 10*3/uL (ref 0.7–4.0)
MCH: 29.5 pg (ref 26.0–34.0)
MCHC: 31.5 g/dL (ref 30.0–36.0)
MCV: 93.7 fL (ref 80.0–100.0)
Monocytes Absolute: 0.7 10*3/uL (ref 0.1–1.0)
Monocytes Relative: 9 %
Neutro Abs: 5.2 10*3/uL (ref 1.7–7.7)
Neutrophils Relative %: 69 %
Platelets: 135 10*3/uL — ABNORMAL LOW (ref 150–400)
RBC: 4.74 MIL/uL (ref 3.87–5.11)
RDW: 18.8 % — ABNORMAL HIGH (ref 11.5–15.5)
WBC: 7.6 10*3/uL (ref 4.0–10.5)
nRBC: 0 % (ref 0.0–0.2)

## 2019-05-14 LAB — FERRITIN: Ferritin: 256 ng/mL (ref 11–307)

## 2019-05-14 LAB — VITAMIN B12: Vitamin B-12: 3238 pg/mL — ABNORMAL HIGH (ref 180–914)

## 2019-05-14 NOTE — Progress Notes (Signed)
Patient reports feeling fatigued.  The iron infusions did help with her energy.  She is taking oral iron 1 BID.

## 2019-05-15 ENCOUNTER — Encounter: Payer: Self-pay | Admitting: Oncology

## 2019-05-15 ENCOUNTER — Telehealth: Payer: Self-pay | Admitting: Gastroenterology

## 2019-05-15 NOTE — Progress Notes (Signed)
Hematology/Oncology Consult note [facility]  Telephone:([phone] [phone]  Patient Care Team: [place], [place] [facility] as PCP - General (Family Medicine) Fredrich Romans, NP as Nurse Practitioner (Family Medicine)   Name of the patient: Talon Witting  [id number]  Oct 29, 1970   Date of visit: 05/15/19  Diagnosis-iron deficiency anemia due to GI bleed  Chief complaint/ Reason for visit-routine follow-up of iron deficiency anemia  Heme/Onc history: Patient is a 48 yr old female who presented to the ER on 03/17/19 with symptoms of fatigue, generalized weakness. She has been having chronic abdominal pain and has been taking BC powder twice daily. Her H/H was found to be 6.7/23.2. she had child care responsibilities and did not wait to get blood transfusion. She was then seen by pcp who recommended GI work up which she refused.  After my first visit with her I referred her to GI but before she could be seen by them she presented to the ER with symptoms of hematemesis and melena and was found to have a hemoglobin of 4.  She received multiple units of blood transfusion and upper endoscopy showed a large gastric ulcer.  She was started on PPI.  She also received 3 doses of Feraheme so far.  Interval history-overall patient is doing much better at this time.  Reports that she has gained weight and she has been staying away from [place] powder/BC powder as well as NSAIDs.  Denies any abdominal pain or melena at this time  ECOG PS- 1 Pain scale- 0 Opioid associated constipation- no  Review of systems- Review of Systems  Constitutional: Negative for chills, fever, malaise/fatigue and weight loss.  HENT: Negative for congestion, ear discharge and nosebleeds.   Eyes: Negative for blurred vision.  Respiratory: Negative for cough, hemoptysis, sputum production, shortness of breath and wheezing.   Cardiovascular: Negative for chest pain, palpitations, orthopnea  and claudication.  Gastrointestinal: Negative for abdominal pain, blood in stool, constipation, diarrhea, heartburn, melena, nausea and vomiting.  Genitourinary: Negative for dysuria, flank pain, frequency, hematuria and urgency.  Musculoskeletal: Negative for back pain, joint pain and myalgias.  Skin: Negative for rash.  Neurological: Negative for dizziness, tingling, focal weakness, seizures, weakness and headaches.  Endo/Heme/Allergies: Does not bruise/bleed easily.  Psychiatric/Behavioral: Negative for depression and suicidal ideas. The patient does not have insomnia.        Allergies  Allergen Reactions  . Penicillins Anaphylaxis    Other reaction(s): SHORTNESS OF BREATH  . Clonidine Other (See Comments)    confusion  . Nefazodone Other (See Comments)    Tachacardia Other reaction(s): OTHER  . Benzoyl Peroxide Other (See Comments) and Rash     Past Medical History:  Diagnosis Date  . Asthma   . COPD (chronic obstructive pulmonary disease) ([place])   . Diabetes mellitus without complication ([place])   . Fibromyalgia   . Hypertension   . MS (multiple sclerosis) ([place])   . Restrictive airway disease   . Thyroid disease      Past Surgical History:  Procedure Laterality Date  . ABDOMINAL HYSTERECTOMY    . CESAREAN SECTION    . ESOPHAGOGASTRODUODENOSCOPY (EGD) WITH PROPOFOL N/A 03/20/2019   Procedure: ESOPHAGOGASTRODUODENOSCOPY (EGD) WITH PROPOFOL;  Surgeon: Jonathon Bellows, MD;  Location[facility] END[facility]COPY;  Service: Gastroenterology;  Laterality: N/A;  . NISSEN FUNDOPLICATION      Social History   Socioeconomic History  . Marital status: Married    Spouse name: Not on file  . Number of  children: Not on file  . Years of education: Not on file  . Highest education level: Not on file  Occupational History  . Not on file  Social Needs  . Financial resource strain: Not on file  . Food insecurity    Worry: Not on file    Inability: Not on file  . Transportation needs     Medical: Not on file    Non-medical: Not on file  Tobacco Use  . Smoking status: Current Every Day Smoker    Packs/day: 0.50    Types: Cigarettes  . Smokeless tobacco: Never Used  Substance and Sexual Activity  . Alcohol use: No    Alcohol/week: 0.0 standard drinks  . Drug use: No  . Sexual activity: Not Currently  Lifestyle  . Physical activity    Days per week: Not on file    Minutes per session: Not on file  . Stress: Not on file  Relationships  . Social Herbalist on phone: Not on file    Gets together: Not on file    Attends religious service: Not on file    Active member of club or organization: Not on file    Attends meetings of clubs or organizations: Not on file    Relationship status: Not on file  . Intimate partner violence    Fear of current or ex partner: Not on file    Emotionally abused: Not on file    Physically abused: Not on file    Forced sexual activity: Not on file  Other Topics Concern  . Not on file  Social History Narrative  . Not on file    Family History  Adopted: Yes  Problem Relation Age of Onset  . Breast cancer Mother   . Heart failure Father   . Hypertension Father   . Lupus Brother   . Diabetes Brother   . Breast cancer Maternal Aunt   . Diabetes Maternal Grandmother   . Diabetes Maternal Grandfather   . Lung cancer Paternal Grandfather      Current Outpatient Medications:  .  cyanocobalamin 1000 MCG tablet, Take 1,000 mcg by mouth 2 (two) times daily., Disp: , Rfl:  .  ferrous sulfate 325 (65 FE) MG EC tablet, Take 1 tablet (325 mg total) by mouth 2 (two) times daily., Disp: 60 tablet, Rfl: 3 .  furosemide (LASIX) 20 MG tablet, Take 20 mg by mouth daily., Disp: , Rfl:  .  gabapentin (NEURONTIN) 300 MG capsule, Take 900 mg by mouth 3 (three) times daily. , Disp: , Rfl:  .  Multiple Vitamin (MULTIVITAMIN) tablet, Take 1 tablet by mouth daily., Disp: , Rfl:  .  sucralfate (CARAFATE) 1 g tablet, Take by mouth., Disp: ,  Rfl:  .  albuterol (PROVENTIL HFA;VENTOLIN HFA) 108 (90 BASE) MCG/ACT inhaler, Frequency:PHARMDIR   Dosage:90   MCG  Instructions:  Note:2 puff q4-6 hrs prn shortness of breath or wheezingm use with spacer. Dose: 90MCG, Disp: , Rfl:  .  cetirizine (ZYRTEC) 10 MG tablet, Take 10 mg by mouth daily., Disp: , Rfl:  .  Ipratropium-Albuterol (COMBIVENT) 20-100 MCG/ACT AERS respimat, Inhale 1 puff into the lungs every 6 (six) hours., Disp: , Rfl:  .  mometasone (NASONEX) 50 MCG/ACT nasal spray, Place 2 sprays into the nose daily., Disp: , Rfl:  .  montelukast (SINGULAIR) 10 MG tablet, Take 10 mg by mouth at bedtime., Disp: , Rfl:  .  nicotine (NICODERM CQ - DOSED IN MG/24  HOURS) 21 mg/24hr patch, Place 1 patch (21 mg total) onto the skin daily. (Patient not taking: Reported on 05/14/2019), Disp: 28 patch, Rfl: 0 .  pantoprazole (PROTONIX) 40 MG tablet, Take 1 tablet (40 mg total) by mouth 2 (two) times daily for 30 days., Disp: 60 tablet, Rfl: 0 .  promethazine (PHENERGAN) 25 MG tablet, Take 25 mg by mouth every 6 (six) hours as needed. , Disp: , Rfl:   Physical exam:  Vitals:   05/14/19 1440  BP: 121/80  Pulse: 82  Resp: 18  Temp: 97.8 F (36.6 C)  Weight: 103 lb 4.8 oz (46.9 kg)   Physical Exam HENT:     Head: Normocephalic and atraumatic.  Eyes:     Pupils: Pupils are equal, round, and reactive to light.  Neck:     Musculoskeletal: Normal range of motion.  Cardiovascular:     Rate and Rhythm: Normal rate and regular rhythm.     Heart sounds: Normal heart sounds.  Pulmonary:     Effort: Pulmonary effort is normal.     Breath sounds: Normal breath sounds.  Abdominal:     General: Bowel sounds are normal.     Palpations: Abdomen is soft.  Skin:    General: Skin is warm and dry.  Neurological:     Mental Status: She is alert and oriented to person, place, and time.      CMP Latest Ref Rng & Units 03/21/2019  Glucose 70 - 99 mg/dL 91  BUN 6 - 20 mg/dL 20  Creatinine 0.44 - 1.00  mg/dL 0.82  Sodium 135 - 145 mmol/L 142  Potassium 3.5 - 5.1 mmol/L 3.8  Chloride 98 - 111 mmol/L 111  CO2 22 - 32 mmol/L 23  Calcium 8.9 - 10.3 mg/dL 8.4(L)  Total Protein 6.5 - 8.1 g/dL 5.0(L)  Total Bilirubin 0.3 - 1.2 mg/dL 0.5  Alkaline Phos 38 - 126 U/L 23(L)  AST 15 - 41 U/L 18  ALT 0 - 44 U/L 14   CBC Latest Ref Rng & Units 05/14/2019  WBC 4.0 - 10.5 K/uL 7.6  Hemoglobin 12.0 - 15.0 g/dL 14.0  Hematocrit 36.0 - 46.0 % 44.4  Platelets 150 - 400 K/uL 135(L)    No images are attached to the encounter.  No results found.   Assessment and plan- Patient is a 48 y.o. female with iron deficiency anemia secondary to GI bleeding from gastric ulcer  Patient received multiple units of blood transfusion in the hospital in June 2020 and also received 3 doses of IV iron so far.  Her hemoglobin is improved from 4-14 today.  Iron studies are within normal limits.  She does not require any IV iron at this time.  Repeat CBC ferritin and iron studies in 3 months in 6 months and I will see her back in 6 months.  She will need to follow-up with GI for repeat endoscopy to ensure healing of her gastric ulcer.  She may also need colonoscopy in the near future as she is nearing 48.   Visit Diagnosis 1. Iron deficiency anemia, unspecified iron deficiency anemia type      Dr. Randa Evens, MD, MPH [facility] at [facility] [phone] 05/15/2019 1:34 PM

## 2019-05-15 NOTE — Telephone Encounter (Signed)
Patient called & l/m on v/m asking for a return call. She is scheduled for an EGD on Monday 05-18-19 & has question,may need to r/s.

## 2019-05-15 NOTE — Telephone Encounter (Signed)
Called pt to reschedule endoscopy procedure, as pt did not complete her COVID test.  Unable to contact, LVM to return call

## 2019-05-15 NOTE — Telephone Encounter (Signed)
Spoke with pt and was able to reschedule procedure to 06-08-19. Pt is aware of COVID testing date.

## 2019-05-21 ENCOUNTER — Telehealth: Payer: Self-pay | Admitting: *Deleted

## 2019-05-21 ENCOUNTER — Encounter: Payer: Self-pay | Admitting: Oncology

## 2019-05-21 NOTE — Telephone Encounter (Signed)
Pt send my chart message about her b12 level and wanted me to call her . I called and said that as I am reading on chart that her levels were up but according to the med list she is taking 2013mcg of b12 a day. It just means she does not need it right now. She says she stopped about maybe 2 weeks ago. She will continue to be off of it. She then asked about platelet count. It was over 200 and then last 2 times it is in 130's.  I told her that I can ask Dr. Janese Banks about this but over 100 is a good number. Sometimes med side effects and alcohol can cause decrease level. She states that she does not drink any alcohol due to her ulcer she had. She checked with PCP and all meds she is on does not cause a plt. Drop. I told her that I would pass info to Janese Banks and see what she advises for pt. Pt. Ok with this

## 2019-05-22 NOTE — Telephone Encounter (Signed)
Called pt and got voice mail and left her the message below from [place] and if it cont. To trend down we will look into it but right now she is good in 130"s for plt

## 2019-05-22 NOTE — Telephone Encounter (Signed)
Platelet counts can fluctuate. She has had platelet count in 130's in June as well. It is mildly low and I would not worry about it as of now. If we see a consistent downward trend we can worry about it

## 2019-06-04 ENCOUNTER — Other Ambulatory Visit: Admission: RE | Admit: 2019-06-04 | Payer: MEDICAID | Source: Ambulatory Visit

## 2019-06-07 DIAGNOSIS — K279 Peptic ulcer, site unspecified, unspecified as acute or chronic, without hemorrhage or perforation: Secondary | ICD-10-CM | POA: Insufficient documentation

## 2019-06-08 ENCOUNTER — Ambulatory Visit: Admission: RE | Admit: 2019-06-08 | Payer: MEDICAID | Source: Home / Self Care | Admitting: Gastroenterology

## 2019-06-08 ENCOUNTER — Encounter: Admission: RE | Payer: Self-pay | Source: Home / Self Care

## 2019-06-08 DIAGNOSIS — M47816 Spondylosis without myelopathy or radiculopathy, lumbar region: Secondary | ICD-10-CM | POA: Insufficient documentation

## 2019-06-08 DIAGNOSIS — I7090 Unspecified atherosclerosis: Secondary | ICD-10-CM | POA: Insufficient documentation

## 2019-06-08 SURGERY — ESOPHAGOGASTRODUODENOSCOPY (EGD) WITH PROPOFOL
Anesthesia: General

## 2019-07-01 ENCOUNTER — Ambulatory Visit: Payer: Self-pay | Admitting: Gastroenterology

## 2019-07-01 ENCOUNTER — Encounter: Payer: Self-pay | Admitting: Gastroenterology

## 2019-08-17 ENCOUNTER — Other Ambulatory Visit: Payer: Self-pay

## 2019-08-19 ENCOUNTER — Inpatient Hospital Stay: Payer: MEDICAID | Attending: Oncology

## 2019-11-17 ENCOUNTER — Telehealth: Payer: Self-pay | Admitting: Oncology

## 2019-11-17 ENCOUNTER — Inpatient Hospital Stay: Payer: Self-pay | Admitting: Oncology

## 2019-11-17 ENCOUNTER — Inpatient Hospital Stay: Payer: Self-pay | Attending: Oncology

## 2019-11-17 NOTE — Telephone Encounter (Signed)
Patient missed lab and MD appt on 11-17-19. Writer phoned patient and left voicemail for patient to reschedule.

## 2019-11-20 ENCOUNTER — Telehealth: Payer: Self-pay | Admitting: Oncology

## 2019-11-20 NOTE — Telephone Encounter (Signed)
Spoke with patient and rescheduled appts. Lab is 11-25-19 and MD virtual visit is 11-26-19.

## 2019-11-24 ENCOUNTER — Other Ambulatory Visit: Payer: Self-pay

## 2019-11-25 ENCOUNTER — Inpatient Hospital Stay: Payer: Self-pay | Attending: Oncology

## 2019-11-26 ENCOUNTER — Telehealth: Payer: Self-pay | Admitting: *Deleted

## 2019-11-26 ENCOUNTER — Other Ambulatory Visit: Payer: Self-pay

## 2019-11-26 ENCOUNTER — Inpatient Hospital Stay: Payer: Self-pay | Admitting: Oncology

## 2019-11-26 NOTE — Telephone Encounter (Signed)
I called pt cell phone and no answer then I called home phone and left message. She did not come to have labs yest. And has appt for today but we need her tohave labs first. I left the message to call us so we can r/s the lab and md visit through video. Left my direct number.

## 2019-12-07 ENCOUNTER — Encounter: Payer: Self-pay | Admitting: Oncology

## 2020-02-03 ENCOUNTER — Telehealth: Payer: Self-pay

## 2020-02-03 NOTE — Telephone Encounter (Signed)
Pt called states surgery 03/2019 Dr. Vicente Males. Pt having symptoms heartburn below rib cage middle and right side. Pt states was unable to have endoscopy Aug 2020 due to [place]. Please call pt [phone].

## 2020-02-04 NOTE — Telephone Encounter (Signed)
Spoke with pt regarding her request for an appointment. Pt states she's experiencing upper abdominal pain, severe acid reflux, and has black stools. We have scheduled pt's office visit for next Wednesday as pt requires a morning appointment.

## 2020-02-10 ENCOUNTER — Ambulatory Visit: Payer: Self-pay | Admitting: Gastroenterology

## 2020-02-24 DIAGNOSIS — R87810 Cervical high risk human papillomavirus (HPV) DNA test positive: Secondary | ICD-10-CM | POA: Insufficient documentation

## 2020-06-26 ENCOUNTER — Ambulatory Visit
Admission: EM | Admit: 2020-06-26 | Discharge: 2020-06-26 | Disposition: A | Discharge status: Home / Self Care | Payer: 59 | Attending: Emergency Medicine | Admitting: Emergency Medicine

## 2020-06-26 ENCOUNTER — Other Ambulatory Visit: Payer: Self-pay

## 2020-06-26 DIAGNOSIS — Z20822 Contact with and (suspected) exposure to covid-19: Secondary | ICD-10-CM

## 2020-06-26 DIAGNOSIS — J069 Acute upper respiratory infection, unspecified: Secondary | ICD-10-CM

## 2020-06-26 DIAGNOSIS — Z1152 Encounter for screening for COVID-19: Secondary | ICD-10-CM

## 2020-06-26 DIAGNOSIS — J441 Chronic obstructive pulmonary disease with (acute) exacerbation: Secondary | ICD-10-CM

## 2020-06-26 MED ORDER — ALBUTEROL SULFATE HFA 108 (90 BASE) MCG/ACT IN AERS: 1.0000 | INHALATION_SPRAY | Freq: Four times a day (QID) | RESPIRATORY_TRACT | 0 refills | Status: DC | PRN

## 2020-06-26 MED ORDER — DEXAMETHASONE SODIUM PHOSPHATE 10 MG/ML IJ SOLN
10.0000 mg | Freq: Once | INTRAMUSCULAR | Status: AC
  Administered 2020-06-26: 10 mg via INTRAMUSCULAR

## 2020-06-26 MED ORDER — ALBUTEROL SULFATE HFA 108 (90 BASE) MCG/ACT IN AERS
2.0000 | INHALATION_SPRAY | Freq: Once | RESPIRATORY_TRACT | Status: AC
  Administered 2020-06-26: 2 via RESPIRATORY_TRACT

## 2020-06-26 MED ORDER — AZITHROMYCIN 250 MG PO TABS: 250.0000 mg | ORAL_TABLET | Freq: Every day | ORAL | 0 refills | Status: DC

## 2020-06-26 MED ORDER — BENZONATATE 100 MG PO CAPS: 100.0000 mg | ORAL_CAPSULE | Freq: Three times a day (TID) | ORAL | 0 refills | Status: DC

## 2020-06-26 MED ORDER — PREDNISONE 10 MG (21) PO TBPK: ORAL_TABLET | Freq: Every day | ORAL | 0 refills | Status: DC

## 2020-06-26 MED ORDER — FLUTICASONE-SALMETEROL 250-50 MCG/DOSE IN AEPB: 1.0000 | INHALATION_SPRAY | Freq: Two times a day (BID) | RESPIRATORY_TRACT | 0 refills | Status: DC

## 2020-06-26 MED ORDER — IPRATROPIUM-ALBUTEROL 20-100 MCG/ACT IN AERS: 1.0000 | INHALATION_SPRAY | Freq: Four times a day (QID) | RESPIRATORY_TRACT | 0 refills | Status: DC

## 2020-06-26 NOTE — Discharge Instructions (Addendum)
COPD exacerbation:  Inhaler given in office for shortness of breath or wheezing Get plenty of rest and push fluids Steroid shot given in office Prednisone prescribed.  Take as directed and to completion Azithromycin prescribed.  Take as directed and to completion Follow up with PCP for recheck and/or if symptoms persists Return or go to ER if you have any new or worsening symptoms such as fever, chills, fatigue, shortness of breath, wheezing, chest pain, nausea, changes in bowel or bladder habits, etc...   Viral URI symptoms: COVID testing ordered.  It will take between 5-7 days for test results.  Someone will contact you regarding abnormal results.    In the meantime: You should remain isolated in your home for 10 days from symptom onset AND greater than 72 hours after symptoms resolution (absence of fever without the use of fever-reducing medication and improvement in respiratory symptoms), whichever is longer Get plenty of rest and push fluids Tessalon Perles prescribed for cough Use OTC zyrtec for nasal congestion, runny nose, and/or sore throat Use OTC flonase for nasal congestion and runny nose Use medications daily for symptom relief Use OTC medications like ibuprofen or tylenol as needed fever or pain Call or go to the ED if you have any new or worsening symptoms such as fever, worsening cough, shortness of breath, chest tightness, chest pain, turning blue, changes in mental status, etc..Marland Kitchen

## 2020-06-26 NOTE — ED Provider Notes (Signed)
[facility]   [id number] 06/26/20 Arrival Time: 0762   CC: COVID symptoms  SUBJECTIVE: History from: patient.  Lacey May is a 49 y.o. female who presents with cough, sore throat, loss of taste/ smell, fever x 3 days. Hx significant for COPD and asthma.  Request inhaler refill.  Denies sick exposure to COVID, flu or strep.  Denies alleviating or aggravating factors.  Denies previous COVID infection.   Patient partially vaccination.  Denies SOB, wheezing, chest pain, nausea, changes in bowel or bladder habits.    ROS: As per HPI.  All other pertinent ROS negative.     Past Medical History:  Diagnosis Date   Asthma    COPD (chronic obstructive pulmonary disease) ([place])    Diabetes mellitus without complication ([place])    Fibromyalgia    Hypertension    MS (multiple sclerosis) ([place])    Restrictive airway disease    Thyroid disease    Past Surgical History:  Procedure Laterality Date   ABDOMINAL HYSTERECTOMY     CESAREAN SECTION     ESOPHAGOGASTRODUODENOSCOPY (EGD) WITH PROPOFOL N/A 03/20/2019   Procedure: ESOPHAGOGASTRODUODENOSCOPY (EGD) WITH PROPOFOL;  Surgeon: Jonathon Bellows, MD;  Location: [facility] ENDOSCOPY;  Service: Gastroenterology;  Laterality: N/A;   NISSEN FUNDOPLICATION     Allergies  Allergen Reactions   Penicillins Anaphylaxis    Other reaction(s): SHORTNESS OF BREATH   Clonidine Other (See Comments)    confusion   Nefazodone Other (See Comments)    Tachacardia Other reaction(s): OTHER   Benzoyl Peroxide Other (See Comments) and Rash   No current facility-administered medications on file prior to encounter.   Current Outpatient Medications on File Prior to Encounter  Medication Sig Dispense Refill   cetirizine (ZYRTEC) 10 MG tablet Take 10 mg by mouth daily.     cyanocobalamin 1000 MCG tablet Take 1,000 mcg by mouth 2 (two) times daily.     ferrous sulfate 325 (65 FE) MG EC tablet Take 1 tablet (325 mg total) by mouth 2 (two) times  daily. 60 tablet 3   furosemide (LASIX) 20 MG tablet Take 20 mg by mouth daily.     gabapentin (NEURONTIN) 300 MG capsule Take 900 mg by mouth 3 (three) times daily.      Ipratropium-Albuterol (COMBIVENT) 20-100 MCG/ACT AERS respimat Inhale 1 puff into the lungs every 6 (six) hours.     mometasone (NASONEX) 50 MCG/ACT nasal spray Place 2 sprays into the nose daily.     montelukast (SINGULAIR) 10 MG tablet Take 10 mg by mouth at bedtime.     Multiple Vitamin (MULTIVITAMIN) tablet Take 1 tablet by mouth daily.     nicotine (NICODERM CQ - DOSED IN MG/24 HOURS) 21 mg/24hr patch Place 1 patch (21 mg total) onto the skin daily. (Patient not taking: Reported on 05/14/2019) 28 patch 0   pantoprazole (PROTONIX) 40 MG tablet Take 1 tablet (40 mg total) by mouth 2 (two) times daily for 30 days. 60 tablet 0   promethazine (PHENERGAN) 25 MG tablet Take 25 mg by mouth every 6 (six) hours as needed.      sucralfate (CARAFATE) 1 g tablet Take by mouth.     Social History   Socioeconomic History   Marital status: Married    Spouse name: Not on file   Number of children: Not on file   Years of education: Not on file   Highest education level: Not on file  Occupational History   Not on file  Tobacco Use  Smoking status: Current Every Day Smoker    Packs/day: 0.50    Types: Cigarettes   Smokeless tobacco: Never Used  Vaping Use   Vaping Use: Never used  Substance and Sexual Activity   Alcohol use: No    Alcohol/week: 0.0 standard drinks   Drug use: No   Sexual activity: Not Currently  Other Topics Concern   Not on file  Social History Narrative   Not on file   Social Determinants of Health   Financial Resource Strain:    Difficulty of Paying Living Expenses: Not on file  Food Insecurity:    Worried About [place] in the Last Year: Not on file   [employer] of Food in the Last Year: Not on file  Transportation Needs:    Lack of Transportation (Medical): Not  on file   Lack of Transportation (Non-Medical): Not on file  Physical Activity:    Days of Exercise per Week: Not on file   Minutes of Exercise per Session: Not on file  Stress:    Feeling of Stress : Not on file  Social Connections:    Frequency of Communication with Friends and Family: Not on file   Frequency of Social Gatherings with Friends and Family: Not on file   Attends Religious Services: Not on file   Active Member of Clubs or Organizations: Not on file   Attends Archivist Meetings: Not on file   Marital Status: Not on file  Intimate Partner Violence:    Fear of Current or Ex-Partner: Not on file   Emotionally Abused: Not on file   Physically Abused: Not on file   Sexually Abused: Not on file   Family History  Adopted: Yes  Problem Relation Age of Onset   Breast cancer Mother    Heart failure Father    Hypertension Father    Lupus Brother    Diabetes Brother    Breast cancer Maternal Aunt    Diabetes Maternal Grandmother    Diabetes Maternal Grandfather    Lung cancer Paternal Grandfather     OBJECTIVE:  Vitals:   06/26/20 0903  BP: 112/72  Pulse: 80  Resp: 20  Temp: (!) 80 F (26.7 C)  SpO2: 95%    Temp 61F  General appearance: alert; appears fatigued, but nontoxic; speaking in full sentences and tolerating own secretions HEENT: NCAT; Ears: EACs clear, TMs pearly gray; Eyes: PERRL.  EOM grossly intact. Nose: nares patent without rhinorrhea, Throat: oropharynx clear, tonsils non erythematous or enlarged, uvula midline  Neck: supple without LAD Lungs: unlabored respirations, symmetrical air entry; cough: mild, moderate; no respiratory distress; harsh rhonchi and wheezes heard throughout bilateral lungs Heart: regular rate and rhythm.   Skin: warm and dry Psychological: alert and cooperative; normal mood and affect  ASSESSMENT & PLAN:  1. Encounter for screening for COVID-19   2. Viral URI with cough   3. Suspected  COVID-19 virus infection   4. COPD exacerbation ([place])     Meds ordered this encounter  Medications   albuterol (VENTOLIN HFA) 108 (90 Base) MCG/ACT inhaler    Sig: Inhale 1-2 puffs into the lungs every 6 (six) hours as needed for wheezing or shortness of breath.    Dispense:  18 g    Refill:  0    Order Specific Question:   Supervising Provider    Answer:   Raylene Everts [[id number]]   benzonatate (TESSALON) 100 MG capsule    Sig: Take 1  capsule (100 mg total) by mouth every 8 (eight) hours.    Dispense:  21 capsule    Refill:  0    Order Specific Question:   Supervising Provider    Answer:   Raylene Everts [[id number]]   dexamethasone (DECADRON) injection 10 mg   albuterol (VENTOLIN HFA) 108 (90 Base) MCG/ACT inhaler 2 puff   predniSONE (STERAPRED UNI-PAK 21 TAB) 10 MG (21) TBPK tablet    Sig: Take by mouth daily. Take 6 tabs by mouth daily  for 2 days, then 5 tabs for 2 days, then 4 tabs for 2 days, then 3 tabs for 2 days, 2 tabs for 2 days, then 1 tab by mouth daily for 2 days    Dispense:  42 tablet    Refill:  0    Order Specific Question:   Supervising Provider    Answer:   Raylene Everts [[id number]]   azithromycin (ZITHROMAX) 250 MG tablet    Sig: Take 1 tablet (250 mg total) by mouth daily. Take first 2 tablets together, then 1 every day until finished.    Dispense:  6 tablet    Refill:  0    Order Specific Question:   Supervising Provider    Answer:   Raylene Everts [[id number]]   COPD exacerbation:  Inhaler given in office for shortness of breath or wheezing Get plenty of rest and push fluids Steroid shot given in office Prednisone prescribed.  Take as directed and to completion Azithromycin prescribed.  Take as directed and to completion Follow up with PCP for recheck and/or if symptoms persists Return or go to ER if you have any new or worsening symptoms such as fever, chills, fatigue, shortness of breath, wheezing, chest pain, nausea, changes in bowel  or bladder habits, etc...   Viral URI symptoms: COVID testing ordered.  It will take between 5-7 days for test results.  Someone will contact you regarding abnormal results.    In the meantime: You should remain isolated in your home for 10 days from symptom onset AND greater than 72 hours after symptoms resolution (absence of fever without the use of fever-reducing medication and improvement in respiratory symptoms), whichever is longer Get plenty of rest and push fluids Tessalon Perles prescribed for cough Use OTC zyrtec for nasal congestion, runny nose, and/or sore throat Use OTC flonase for nasal congestion and runny nose Use medications daily for symptom relief Use OTC medications like ibuprofen or tylenol as needed fever or pain Call or go to the ED if you have any new or worsening symptoms such as fever, worsening cough, shortness of breath, chest tightness, chest pain, turning blue, changes in mental status, etc...   Reviewed expectations re: course of current medical issues. Questions answered. Outlined signs and symptoms indicating need for more acute intervention. Patient verbalized understanding. After Visit Summary given.         Lestine Box, PA-C 06/26/20 0930

## 2020-06-26 NOTE — ED Triage Notes (Signed)
Pt presents with c/o cough that began Thursday , needs refill on inhaler

## 2020-06-28 LAB — NOVEL CORONAVIRUS, NAA: SARS-CoV-2, NAA: NOT DETECTED

## 2020-06-28 LAB — SARS-COV-2, NAA 2 DAY TAT

## 2020-10-12 ENCOUNTER — Ambulatory Visit
Admission: EM | Admit: 2020-10-12 | Discharge: 2020-10-12 | Disposition: A | Discharge status: Home / Self Care | Payer: 59 | Attending: Emergency Medicine | Admitting: Emergency Medicine

## 2020-10-12 ENCOUNTER — Ambulatory Visit (INDEPENDENT_AMBULATORY_CARE_PROVIDER_SITE_OTHER): Payer: Self-pay

## 2020-10-12 ENCOUNTER — Encounter: Payer: Self-pay | Admitting: Emergency Medicine

## 2020-10-12 DIAGNOSIS — M25562 Pain in left knee: Secondary | ICD-10-CM

## 2020-10-12 DIAGNOSIS — W19XXXA Unspecified fall, initial encounter: Secondary | ICD-10-CM

## 2020-10-12 MED ORDER — IBUPROFEN 800 MG PO TABS: 800.0000 mg | ORAL_TABLET | Freq: Three times a day (TID) | ORAL | 0 refills | Status: DC

## 2020-10-12 NOTE — ED Triage Notes (Signed)
Fell last night on ice in a parking lot.  Pain to left knee

## 2020-10-12 NOTE — Discharge Instructions (Addendum)
Ibuprofen 800 mg was prescribed for pain/take with food Follow-up with PCP Follow RICE instruction that is attached Return or go to ED if you develop any new or worsening of his symptoms

## 2020-10-12 NOTE — ED Provider Notes (Signed)
[facility]ENTER   [id number] 10/12/20 Arrival Time: 1055   Chief Complaint  Patient presents with  . Fall     SUBJECTIVE: History from: patient.  Lacey May is a 50 y.o. female who presented to the urgent care with a complaint of fall on ice in the parking lot that occurred last night.  She localizes the pain to the left knee.  Described the pain as constant and achy.  Has tried OTC medications without relief.  Her symptoms are made worse with ROM.  She denies similar symptoms in the past.  Denies chills, fever, nausea, vomiting, diarrhea  ROS: As per HPI.  All other pertinent ROS negative.      Past Medical History:  Diagnosis Date  . Asthma   . COPD (chronic obstructive pulmonary disease) (HCC)   . Diabetes mellitus without complication (HCC)   . Fibromyalgia   . Hypertension   . MS (multiple sclerosis) (HCC)   . Restrictive airway disease   . Thyroid disease    Past Surgical History:  Procedure Laterality Date  . ABDOMINAL HYSTERECTOMY    . CESAREAN SECTION    . ESOPHAGOGASTRODUODENOSCOPY (EGD) WITH PROPOFOL N/A 03/20/2019   Procedure: ESOPHAGOGASTRODUODENOSCOPY (EGD) WITH PROPOFOL;  Surgeon: Wyline Mood, MD;  Location: [facility] ENDOSCOPY;  Service: Gastroenterology;  Laterality: N/A;  . NISSEN FUNDOPLICATION     Allergies  Allergen Reactions  . Penicillins Anaphylaxis    Other reaction(s): SHORTNESS OF BREATH  . Clonidine Other (See Comments)    confusion  . Nefazodone Other (See Comments)    Tachacardia Other reaction(s): OTHER  . Benzoyl Peroxide Other (See Comments) and Rash   No current facility-administered medications on file prior to encounter.   Current Outpatient Medications on File Prior to Encounter  Medication Sig Dispense Refill  . albuterol (VENTOLIN HFA) 108 (90 Base) MCG/ACT inhaler Inhale 1-2 puffs into the lungs every 6 (six) hours as needed for wheezing or shortness of breath. 18 g 0  . azithromycin (ZITHROMAX) 250 MG tablet Take 1  tablet (250 mg total) by mouth daily. Take first 2 tablets together, then 1 every day until finished. 6 tablet 0  . benzonatate (TESSALON) 100 MG capsule Take 1 capsule (100 mg total) by mouth every 8 (eight) hours. 21 capsule 0  . cetirizine (ZYRTEC) 10 MG tablet Take 10 mg by mouth daily.    . cyanocobalamin 1000 MCG tablet Take 1,000 mcg by mouth 2 (two) times daily.    . ferrous sulfate 325 (65 FE) MG EC tablet Take 1 tablet (325 mg total) by mouth 2 (two) times daily. 60 tablet 3  . Fluticasone-Salmeterol (ADVAIR) 250-50 MCG/DOSE AEPB Inhale 1 puff into the lungs in the morning and at bedtime. 60 each 0  . furosemide (LASIX) 20 MG tablet Take 20 mg by mouth daily.    Marland Kitchen gabapentin (NEURONTIN) 300 MG capsule Take 900 mg by mouth 3 (three) times daily.     . Ipratropium-Albuterol (COMBIVENT) 20-100 MCG/ACT AERS respimat Inhale 1 puff into the lungs every 6 (six) hours. 4 g 0  . mometasone (NASONEX) 50 MCG/ACT nasal spray Place 2 sprays into the nose daily.    . montelukast (SINGULAIR) 10 MG tablet Take 10 mg by mouth at bedtime.    . Multiple Vitamin (MULTIVITAMIN) tablet Take 1 tablet by mouth daily.    . nicotine (NICODERM CQ - DOSED IN MG/24 HOURS) 21 mg/24hr patch Place 1 patch (21 mg total) onto the skin daily. (Patient not taking: Reported  on 05/14/2019) 28 patch 0  . pantoprazole (PROTONIX) 40 MG tablet Take 1 tablet (40 mg total) by mouth 2 (two) times daily for 30 days. 60 tablet 0  . predniSONE (STERAPRED UNI-PAK 21 TAB) 10 MG (21) TBPK tablet Take by mouth daily. Take 6 tabs by mouth daily  for 2 days, then 5 tabs for 2 days, then 4 tabs for 2 days, then 3 tabs for 2 days, 2 tabs for 2 days, then 1 tab by mouth daily for 2 days 42 tablet 0  . promethazine (PHENERGAN) 25 MG tablet Take 25 mg by mouth every 6 (six) hours as needed.     . sucralfate (CARAFATE) 1 g tablet Take by mouth.     Social History   Socioeconomic History  . Marital status: Married    Spouse name: Not on file  .  Number of children: Not on file  . Years of education: Not on file  . Highest education level: Not on file  Occupational History  . Not on file  Tobacco Use  . Smoking status: Current Every Day Smoker    Packs/day: 0.50    Types: Cigarettes  . Smokeless tobacco: Never Used  Vaping Use  . Vaping Use: Never used  Substance and Sexual Activity  . Alcohol use: No    Alcohol/week: 0.0 standard drinks  . Drug use: No  . Sexual activity: Not Currently  Other Topics Concern  . Not on file  Social History Narrative  . Not on file   Social Determinants of Health   Financial Resource Strain: Not on file  Food Insecurity: Not on file  Transportation Needs: Not on file  Physical Activity: Not on file  Stress: Not on file  Social Connections: Not on file  Intimate Partner Violence: Not on file   Family History  Adopted: Yes  Problem Relation Age of Onset  . Breast cancer Mother   . Heart failure Father   . Hypertension Father   . Lupus Brother   . Diabetes Brother   . Breast cancer Maternal Aunt   . Diabetes Maternal Grandmother   . Diabetes Maternal Grandfather   . Lung cancer Paternal Grandfather     OBJECTIVE:  Vitals:   10/12/20 1159 10/12/20 1200  BP: 101/69   Pulse: 75   Resp: 18   Temp: 98.3 F (36.8 C)   TempSrc: Oral   SpO2: 95%   Weight:  107 lb (48.5 kg)  Height:  5\' 3"  (1.6 m)     Physical Exam Vitals and nursing note reviewed.  Constitutional:      General: She is not in acute distress.    Appearance: Normal appearance. She is normal weight. She is not ill-appearing, toxic-appearing or diaphoretic.  HENT:     Head: Normocephalic.  Cardiovascular:     Rate and Rhythm: Normal rate and regular rhythm.     Pulses: Normal pulses.     Heart sounds: Normal heart sounds. No murmur heard. No friction rub. No gallop.   Pulmonary:     Effort: Pulmonary effort is normal. No respiratory distress.     Breath sounds: Normal breath sounds. No stridor. No  wheezing, rhonchi or rales.  Chest:     Chest wall: No tenderness.  Musculoskeletal:        General: Tenderness present.     Left knee: Tenderness present.     Comments: Left knee is without obvious asymmetry or deformity compared to the right knee.  There is  no ecchymosis, open wound, lesion, swelling or surface trauma present.  Limited range of motion.  Neurovascular status intact  Neurological:     Mental Status: She is alert and oriented to person, place, and time.     LABS:  No results found for this or any previous visit (from the past 24 hour(s)).   RADIOLOGY:  DG Knee Complete 4 Views Left  Result Date: 10/12/2020 CLINICAL DATA:  Pain following fall EXAM: LEFT KNEE - COMPLETE 4+ VIEW COMPARISON:  None. FINDINGS: Frontal, lateral, and bilateral oblique views were obtained. No appreciable fracture or dislocation. No evident joint effusion. No joint space narrowing or erosion. IMPRESSION: No fracture, dislocation, or joint effusion. No evident arthropathy. Electronically Signed   By: Lowella Grip III M.D.   On: 10/12/2020 12:31   Left knee x-ray is negative for bony abnormality including fracture or dislocation.  I have reviewed the x-ray myself and the radiologist interpretation.  I am in agreement with the radiologist interpretation.   ASSESSMENT & PLAN:  1. Fall, initial encounter   2. Acute pain of left knee     Meds ordered this encounter  Medications  . ibuprofen (ADVIL) 800 MG tablet    Sig: Take 1 tablet (800 mg total) by mouth 3 (three) times daily.    Dispense:  30 tablet    Refill:  0    Discharge instructions  Ibuprofen 800 mg was prescribed for pain/take with food Follow-up with PCP Follow RICE instruction that is attached Return or go to ED if you develop any new or worsening of his symptoms  Reviewed expectations re: course of current medical issues. Questions answered. Outlined signs and symptoms indicating need for more acute  intervention. Patient verbalized understanding. After Visit Summary given.         Emerson Monte, [place] 10/12/20 1257

## 2021-01-04 ENCOUNTER — Encounter: Payer: Self-pay | Admitting: Emergency Medicine

## 2021-01-04 ENCOUNTER — Ambulatory Visit
Admission: EM | Admit: 2021-01-04 | Discharge: 2021-01-04 | Disposition: A | Discharge status: Home / Self Care | Payer: Self-pay | Attending: Family Medicine | Admitting: Family Medicine

## 2021-01-04 ENCOUNTER — Other Ambulatory Visit: Payer: Self-pay

## 2021-01-04 DIAGNOSIS — I10 Essential (primary) hypertension: Secondary | ICD-10-CM

## 2021-01-04 DIAGNOSIS — Z20822 Contact with and (suspected) exposure to covid-19: Secondary | ICD-10-CM

## 2021-01-04 DIAGNOSIS — K529 Noninfective gastroenteritis and colitis, unspecified: Secondary | ICD-10-CM

## 2021-01-04 MED ORDER — ONDANSETRON 4 MG PO TBDP: 4.0000 mg | ORAL_TABLET | Freq: Three times a day (TID) | ORAL | 0 refills | Status: DC | PRN

## 2021-01-04 NOTE — ED Triage Notes (Signed)
Headache since Monday, diarrhea, nausea and vomiting and fever.  Runny nose

## 2021-01-04 NOTE — Discharge Instructions (Addendum)
Please do your best to ensure adequate fluid intake in order to avoid dehydration. If you find that you are unable to tolerate drinking fluids regularly please proceed to the Emergency Department for evaluation.  Also, you should return to the hospital if you experience persistent fevers for greater than 1-2 more days, increasing abdominal pain that persists despite medications, persistent diarrhea, dizziness, syncope (fainting), or for any other concerns you may deem worrisome.  You have been tested for COVID-19 today. If your test returns positive, you will receive a phone call from [facility] regarding your results. Negative test results are not called. Both positive and negative results area always visible on MyChart. If you do not have a MyChart account, sign up instructions are provided in your discharge papers. Please do not hesitate to contact us should you have questions or concerns.  Your blood pressure was noted to be elevated during your visit today. If you are currently taking medication for high blood pressure, please ensure you are taking this as directed. If you do not have a history of high blood pressure and your blood pressure remains persistently elevated, you may need to begin taking a medication at some point. You may return here within the next few days to recheck if unable to see your primary care provider or if you do not have a one.  BP (!) 152/96 (BP Location: Right Arm)   Pulse 87   Temp 97.9 F (36.6 C) (Oral)   Resp 18   SpO2 96%   BP Readings from Last 3 Encounters:  01/04/21 (!) 152/96  10/12/20 101/69  06/26/20 112/72

## 2021-01-04 NOTE — ED Provider Notes (Signed)
[place]   [id number] 01/04/21 Arrival Time: 9702  ASSESSMENT & PLAN:  1. Exposure to COVID-19 virus   2. Gastroenteritis   3. Elevated blood pressure reading in office with diagnosis of hypertension    No signs of dehydration req IVF at this time. Work note provided  Meds ordered this encounter  Medications  . ondansetron (ZOFRAN-ODT) 4 MG disintegrating tablet    Sig: Take 1 tablet (4 mg total) by mouth every 8 (eight) hours as needed for nausea or vomiting.    Dispense:  15 tablet    Refill:  0     Discharge Instructions      Please do your best to ensure adequate fluid intake in order to avoid dehydration. If you find that you are unable to tolerate drinking fluids regularly please proceed to the Emergency Department for evaluation.  Also, you should return to the hospital if you experience persistent fevers for greater than 1-2 more days, increasing abdominal pain that persists despite medications, persistent diarrhea, dizziness, syncope (fainting), or for any other concerns you may deem worrisome.  You have been tested for COVID-19 today. If your test returns positive, you will receive a phone call from [facility] regarding your results. Negative test results are not called. Both positive and negative results area always visible on MyChart. If you do not have a MyChart account, sign up instructions are provided in your discharge papers. Please do not hesitate to contact us should you have questions or concerns.  Your blood pressure was noted to be elevated during your visit today. If you are currently taking medication for high blood pressure, please ensure you are taking this as directed. If you do not have a history of high blood pressure and your blood pressure remains persistently elevated, you may need to begin taking a medication at some point. You may return here within the next few days to recheck if unable to see your primary care provider or if you  do not have a one.  BP (!) 152/96 (BP Location: Right Arm)   Pulse 87   Temp 97.9 F (36.6 C) (Oral)   Resp 18   SpO2 96%   BP Readings from Last 3 Encounters:  01/04/21 (!) 152/96  10/12/20 101/69  06/26/20 112/72         Discussed typical duration of symptoms for suspected viral GI illness. Will do her best to ensure adequate fluid intake in order to avoid dehydration. Will proceed to the Emergency Department for evaluation if unable to tolerate PO fluids regularly.  Otherwise she will f/u with her PCP or here if not showing improvement over the next 48-72 hours.  Reviewed expectations re: course of current medical issues. Questions answered. Outlined signs and symptoms indicating need for more acute intervention. Patient verbalized understanding. After Visit Summary given.   SUBJECTIVE: History from: patient.  Lacey May is a 50 y.o. female who reports:  1) Exposure to COVID-19; co-worker; requests testing. Mild runny nose for a few days. 2) Abrupt onset of non-bloody emesis and diarrhea; x 2-3 d; stable and not worsening; mild "cramping" abd discomfort. Tolerating PO fluids; decreased appetite. Afebrile. No current abd pain. 3) Increased blood pressure noted today. Reports that she is not currently tx. She reports no chest pain on exertion, no dyspnea on exertion, no swelling of ankles, no orthostatic dizziness or lightheadedness, no orthopnea or paroxysmal nocturnal dyspnea and no palpitations.  No new medications. No travel.  No LMP recorded. Patient  has had a hysterectomy.  Past Surgical History:  Procedure Laterality Date  . ABDOMINAL HYSTERECTOMY    . CESAREAN SECTION    . ESOPHAGOGASTRODUODENOSCOPY (EGD) WITH PROPOFOL N/A 03/20/2019   Procedure: ESOPHAGOGASTRODUODENOSCOPY (EGD) WITH PROPOFOL;  Surgeon: Jonathon Bellows, MD;  Location[facility] ENDOSCOPY;  Service: Gastroenterology;  Laterality: N/A;  . NISSEN FUNDOPLICATION      OBJECTIVE:  Vitals:    01/04/21 1216  BP: (!) 152/96  Pulse: 87  Resp: 18  Temp: 97.9 F (36.6 C)  TempSrc: Oral  SpO2: 96%    General appearance: alert; no distress but appears fatigued Oropharynx: moist Lungs: clear to auscultation bilaterally; unlabored Heart: regular rate and rhythm Abdomen: soft; non-distended; no significant abdominal tenderness; reports "cramping" feeling; no guarding or rebound tenderness Back: no CVA tenderness Extremities: no edema; symmetrical with no gross deformities Skin: warm; dry Neurologic: normal gait Psychological: alert and cooperative; normal mood and affect  Labs: Pending: Labs Reviewed  COVID-19, FLU A+B NAA    Allergies  Allergen Reactions  . Penicillins Anaphylaxis    Other reaction(s): SHORTNESS OF BREATH  . Clonidine Other (See Comments)    confusion  . Nefazodone Other (See Comments)    Tachacardia Other reaction(s): OTHER  . Nsaids   . Benzoyl Peroxide Other (See Comments) and Rash                                               Past Medical History:  Diagnosis Date  . Asthma   . COPD (chronic obstructive pulmonary disease) ([place])   . Diabetes mellitus without complication ([place])   . Fibromyalgia   . Hypertension   . MS (multiple sclerosis) ([place])   . Restrictive airway disease   . Thyroid disease    Social History   Socioeconomic History  . Marital status: Married    Spouse name: Not on file  . Number of children: Not on file  . Years of education: Not on file  . Highest education level: Not on file  Occupational History  . Not on file  Tobacco Use  . Smoking status: Current Every Day Smoker    Packs/day: 0.50    Types: Cigarettes  . Smokeless tobacco: Never Used  Vaping Use  . Vaping Use: Never used  Substance and Sexual Activity  . Alcohol use: No    Alcohol/week: 0.0 standard drinks  . Drug use: No  . Sexual activity: Not Currently  Other Topics Concern  . Not on file  Social History Narrative  . Not on file   Social  Determinants of Health   Financial Resource Strain: Not on file  Food Insecurity: Not on file  Transportation Needs: Not on file  Physical Activity: Not on file  Stress: Not on file  Social Connections: Not on file  Intimate Partner Violence: Not on file   Family History  Adopted: Yes  Problem Relation Age of Onset  . Breast cancer Mother   . Heart failure Father   . Hypertension Father   . Lupus Brother   . Diabetes Brother   . Breast cancer Maternal Aunt   . Diabetes Maternal Grandmother   . Diabetes Maternal Grandfather   . Lung cancer Paternal Reymundo Poll, MD 01/04/21 1302

## 2021-01-05 LAB — COVID-19, FLU A+B NAA
Influenza A, NAA: NOT DETECTED
Influenza B, NAA: NOT DETECTED
SARS-CoV-2, NAA: NOT DETECTED

## 2021-05-01 ENCOUNTER — Ambulatory Visit: Payer: Self-pay | Admitting: Gastroenterology

## 2021-05-01 ENCOUNTER — Encounter: Payer: Self-pay | Admitting: *Deleted

## 2021-05-01 ENCOUNTER — Other Ambulatory Visit: Payer: Self-pay

## 2021-07-25 ENCOUNTER — Encounter: Payer: Self-pay | Admitting: Oncology

## 2021-10-08 DIAGNOSIS — Z419 Encounter for procedure for purposes other than remedying health state, unspecified: Secondary | ICD-10-CM | POA: Diagnosis not present

## 2021-10-09 ENCOUNTER — Encounter: Payer: Self-pay | Admitting: Emergency Medicine

## 2021-10-09 ENCOUNTER — Telehealth: Payer: Self-pay | Admitting: Emergency Medicine

## 2021-10-09 ENCOUNTER — Other Ambulatory Visit: Payer: Self-pay

## 2021-10-09 ENCOUNTER — Ambulatory Visit
Admission: EM | Admit: 2021-10-09 | Discharge: 2021-10-09 | Disposition: A | Discharge status: Home / Self Care | Payer: Medicaid Other | Attending: Urgent Care | Admitting: Urgent Care

## 2021-10-09 ENCOUNTER — Encounter: Payer: Self-pay | Admitting: Oncology

## 2021-10-09 DIAGNOSIS — U071 COVID-19: Secondary | ICD-10-CM

## 2021-10-09 DIAGNOSIS — G35 Multiple sclerosis: Secondary | ICD-10-CM

## 2021-10-09 DIAGNOSIS — J449 Chronic obstructive pulmonary disease, unspecified: Secondary | ICD-10-CM | POA: Diagnosis not present

## 2021-10-09 DIAGNOSIS — B349 Viral infection, unspecified: Secondary | ICD-10-CM

## 2021-10-09 DIAGNOSIS — Z1152 Encounter for screening for COVID-19: Secondary | ICD-10-CM | POA: Diagnosis not present

## 2021-10-09 DIAGNOSIS — E119 Type 2 diabetes mellitus without complications: Secondary | ICD-10-CM

## 2021-10-09 DIAGNOSIS — Z794 Long term (current) use of insulin: Secondary | ICD-10-CM

## 2021-10-09 MED ORDER — MOLNUPIRAVIR EUA 200MG CAPSULE: 4.0000 | ORAL_CAPSULE | Freq: Two times a day (BID) | ORAL | 0 refills | Status: DC

## 2021-10-09 MED ORDER — PREDNISONE 20 MG PO TABS: ORAL_TABLET | ORAL | 0 refills | Status: DC

## 2021-10-09 MED ORDER — PROMETHAZINE-DM 6.25-15 MG/5ML PO SYRP: 5.0000 mL | ORAL_SOLUTION | Freq: Every evening | ORAL | 0 refills | Status: DC | PRN

## 2021-10-09 MED ORDER — MOLNUPIRAVIR EUA 200MG CAPSULE: 4.0000 | ORAL_CAPSULE | Freq: Two times a day (BID) | ORAL | 0 refills | Status: AC

## 2021-10-09 NOTE — ED Provider Notes (Signed)
[place]   MRN: [id number] DOB: 1971/03/22  Subjective:   Lacey May is a 51 y.o. female presenting for 4 day history of acute onset body aches, fatigue, cough, sinus congestion, sinus drainage. Smokes 1/2ppd.  Has COPD, multiple sclerosis.  She would like a steroid course to help her with her COPD, fatigue and shortness of breath.  Would also like COVID antivirals.  States that she had very close and prolonged exposure to a family member that tested positive for this.  No current facility-administered medications for this encounter.  Current Outpatient Medications:    albuterol (VENTOLIN HFA) 108 (90 Base) MCG/ACT inhaler, Inhale 1-2 puffs into the lungs every 6 (six) hours as needed for wheezing or shortness of breath., Disp: 18 g, Rfl: 0   atorvastatin (LIPITOR) 10 MG tablet, Take 1 tablet by mouth at bedtime., Disp: , Rfl:    cyanocobalamin 1000 MCG tablet, Take 1,000 mcg by mouth 2 (two) times daily., Disp: , Rfl:    ferrous sulfate 325 (65 FE) MG EC tablet, Take 1 tablet (325 mg total) by mouth 2 (two) times daily., Disp: 60 tablet, Rfl: 3   Fluticasone-Salmeterol (ADVAIR) 250-50 MCG/DOSE AEPB, Inhale 1 puff into the lungs in the morning and at bedtime., Disp: 60 each, Rfl: 0   furosemide (LASIX) 20 MG tablet, Take 20 mg by mouth daily., Disp: , Rfl:    gabapentin (NEURONTIN) 300 MG capsule, Take 900 mg by mouth 3 (three) times daily. , Disp: , Rfl:    Ipratropium-Albuterol (COMBIVENT) 20-100 MCG/ACT AERS respimat, Inhale 1 puff into the lungs every 6 (six) hours., Disp: 4 g, Rfl: 0   mometasone (NASONEX) 50 MCG/ACT nasal spray, Place 2 sprays into the nose daily., Disp: , Rfl:    montelukast (SINGULAIR) 10 MG tablet, Take 10 mg by mouth at bedtime., Disp: , Rfl:    Multiple Vitamin (MULTIVITAMIN) tablet, Take 1 tablet by mouth daily., Disp: , Rfl:    nicotine (NICODERM CQ - DOSED IN MG/24 HOURS) 21 mg/24hr patch, Place 1 patch (21 mg total) onto the skin daily.  (Patient not taking: Reported on 05/14/2019), Disp: 28 patch, Rfl: 0   ondansetron (ZOFRAN-ODT) 4 MG disintegrating tablet, Take 1 tablet (4 mg total) by mouth every 8 (eight) hours as needed for nausea or vomiting., Disp: 15 tablet, Rfl: 0   pantoprazole (PROTONIX) 40 MG tablet, Take 1 tablet (40 mg total) by mouth 2 (two) times daily for 30 days., Disp: 60 tablet, Rfl: 0   predniSONE (STERAPRED UNI-PAK 21 TAB) 10 MG (21) TBPK tablet, Take by mouth daily. Take 6 tabs by mouth daily  for 2 days, then 5 tabs for 2 days, then 4 tabs for 2 days, then 3 tabs for 2 days, 2 tabs for 2 days, then 1 tab by mouth daily for 2 days, Disp: 42 tablet, Rfl: 0   promethazine (PHENERGAN) 25 MG tablet, Take 25 mg by mouth every 6 (six) hours as needed. , Disp: , Rfl:    sucralfate (CARAFATE) 1 g tablet, Take by mouth., Disp: , Rfl:    Allergies  Allergen Reactions   Penicillins Anaphylaxis    Other reaction(s): SHORTNESS OF BREATH   Sertraline Palpitations   Clonidine Other (See Comments)    confusion   Nefazodone Other (See Comments)    Tachacardia Other reaction(s): OTHER   Nsaids     Other reaction(s): Unknown Due to internal bleeding   Benzoyl Peroxide Other (See Comments) and Rash    Past  Medical History:  Diagnosis Date   Asthma    COPD (chronic obstructive pulmonary disease) ([place])    Diabetes mellitus without complication ([place])    Fibromyalgia    Hypertension    MS (multiple sclerosis) ([place])    Restrictive airway disease    Thyroid disease      Past Surgical History:  Procedure Laterality Date   ABDOMINAL HYSTERECTOMY     CESAREAN SECTION     ESOPHAGOGASTRODUODENOSCOPY (EGD) WITH PROPOFOL N/A 03/20/2019   Procedure: ESOPHAGOGASTRODUODENOSCOPY (EGD) WITH PROPOFOL;  Surgeon: Jonathon Bellows, MD;  Location: [facility] ENDOSCOPY;  Service: Gastroenterology;  Laterality: N/A;   NISSEN FUNDOPLICATION      Family History  Adopted: Yes  Problem Relation Age of Onset   Breast cancer Mother    Heart  failure Father    Hypertension Father    Lupus Brother    Diabetes Brother    Breast cancer Maternal Aunt    Diabetes Maternal Grandmother    Diabetes Maternal Grandfather    Lung cancer Paternal Grandfather     Social History   Tobacco Use   Smoking status: Every Day    Packs/day: 0.50    Types: Cigarettes   Smokeless tobacco: Never  Vaping Use   Vaping Use: Never used  Substance Use Topics   Alcohol use: No    Alcohol/week: 0.0 standard drinks   Drug use: No    ROS   Objective:   Vitals: BP 135/90    Pulse 77    Temp 98.8 F (37.1 C)    Resp 18    SpO2 95%   Physical Exam Constitutional:      General: She is not in acute distress.    Appearance: Normal appearance. She is well-developed. She is not ill-appearing, toxic-appearing or diaphoretic.  HENT:     Head: Normocephalic and atraumatic.     Right Ear: External ear normal.     Left Ear: External ear normal.     Nose: Nose normal.     Mouth/Throat:     Mouth: Mucous membranes are moist.  Eyes:     Extraocular Movements: Extraocular movements intact.     Pupils: Pupils are equal, round, and reactive to light.  Cardiovascular:     Rate and Rhythm: Normal rate and regular rhythm.     Pulses: Normal pulses.     Heart sounds: Normal heart sounds. No murmur heard.   No friction rub. No gallop.  Pulmonary:     Effort: Pulmonary effort is normal. No respiratory distress.     Breath sounds: Normal breath sounds. No stridor. No wheezing, rhonchi or rales.  Skin:    General: Skin is warm and dry.     Findings: No rash.  Neurological:     Mental Status: She is alert and oriented to person, place, and time.  Psychiatric:        Mood and Affect: Mood normal.        Behavior: Behavior normal.        Thought Content: Thought content normal.    Assessment and Plan :   PDMP not reviewed this encounter.  1. Clinical diagnosis of COVID-19   2. Encounter for screening for COVID-19   3. Acute viral syndrome   4.  Multiple sclerosis ([place])   5. Chronic obstructive pulmonary disease, unspecified COPD type ([place])   6. Type 2 diabetes mellitus treated with insulin ([place])    Patient is high risk given her diabetes, COPD, multiple sclerosis, COPD and  current smoking.  We discussed options for COVID antivirals as she had very close and prolonged exposure to a family member that is positive for COVID-19.  She would like to try this despite not having results available.  I was agreeable given that she is at day 4 and that she has all the aforementioned risk factors.  As I do not have a creatinine level that is recent we will avoid Paxlovid and instead use molnupiravir. Deferred imaging given clear cardiopulmonary exam, hemodynamically stable vital signs. Counseled patient on potential for adverse effects with medications prescribed/recommended today, ER and return-to-clinic precautions discussed, patient verbalized understanding.    Jaynee Eagles, [place] 10/09/21 1927

## 2021-10-09 NOTE — ED Triage Notes (Signed)
Pt here with covid exposure and has fatigue, nasal congestion and drainage and body aches since Friday. Pt has loss of taste.

## 2021-10-09 NOTE — Discharge Instructions (Addendum)
Because you had very close and prolonged exposure to COVID 19 we are starting you on molnupiravir. You are a high risk patient and would benefit from COVID anti-virals. However, if your test is negative, then stop molnupiravir. Go ahead and start the steroids and finish them because this can help through your illness with underlying COPD.

## 2021-10-10 ENCOUNTER — Ambulatory Visit: Payer: Self-pay

## 2021-10-11 LAB — COVID-19, FLU A+B NAA
Influenza A, NAA: NOT DETECTED
Influenza B, NAA: NOT DETECTED
SARS-CoV-2, NAA: NOT DETECTED

## 2021-10-18 DIAGNOSIS — K589 Irritable bowel syndrome without diarrhea: Secondary | ICD-10-CM | POA: Diagnosis not present

## 2021-10-18 DIAGNOSIS — Z139 Encounter for screening, unspecified: Secondary | ICD-10-CM | POA: Diagnosis not present

## 2021-10-18 DIAGNOSIS — M5416 Radiculopathy, lumbar region: Secondary | ICD-10-CM | POA: Diagnosis not present

## 2021-10-18 DIAGNOSIS — F419 Anxiety disorder, unspecified: Secondary | ICD-10-CM | POA: Diagnosis not present

## 2021-10-26 ENCOUNTER — Ambulatory Visit
Admission: EM | Admit: 2021-10-26 | Discharge: 2021-10-26 | Disposition: A | Discharge status: Home / Self Care | Payer: Medicaid Other | Attending: Family Medicine | Admitting: Family Medicine

## 2021-10-26 ENCOUNTER — Emergency Department (HOSPITAL_COMMUNITY)
Admission: EM | Admit: 2021-10-26 | Discharge: 2021-10-26 | Disposition: A | Discharge status: Home / Self Care | Payer: Medicaid Other | Attending: Emergency Medicine | Admitting: Emergency Medicine

## 2021-10-26 ENCOUNTER — Emergency Department (HOSPITAL_COMMUNITY): Payer: Medicaid Other

## 2021-10-26 ENCOUNTER — Encounter: Payer: Self-pay | Admitting: Emergency Medicine

## 2021-10-26 ENCOUNTER — Encounter (HOSPITAL_COMMUNITY): Payer: Self-pay

## 2021-10-26 ENCOUNTER — Other Ambulatory Visit: Payer: Self-pay

## 2021-10-26 DIAGNOSIS — R059 Cough, unspecified: Secondary | ICD-10-CM | POA: Diagnosis not present

## 2021-10-26 DIAGNOSIS — R000 Tachycardia, unspecified: Secondary | ICD-10-CM | POA: Insufficient documentation

## 2021-10-26 DIAGNOSIS — R0602 Shortness of breath: Secondary | ICD-10-CM | POA: Diagnosis present

## 2021-10-26 DIAGNOSIS — R531 Weakness: Secondary | ICD-10-CM | POA: Diagnosis not present

## 2021-10-26 DIAGNOSIS — R52 Pain, unspecified: Secondary | ICD-10-CM | POA: Diagnosis not present

## 2021-10-26 DIAGNOSIS — J441 Chronic obstructive pulmonary disease with (acute) exacerbation: Secondary | ICD-10-CM | POA: Diagnosis not present

## 2021-10-26 DIAGNOSIS — Z7951 Long term (current) use of inhaled steroids: Secondary | ICD-10-CM | POA: Diagnosis not present

## 2021-10-26 DIAGNOSIS — J0100 Acute maxillary sinusitis, unspecified: Secondary | ICD-10-CM

## 2021-10-26 MED ORDER — ALBUTEROL (5 MG/ML) CONTINUOUS INHALATION SOLN: 10.0000 mg | INHALATION_SOLUTION | RESPIRATORY_TRACT | Status: AC

## 2021-10-26 MED ORDER — NICOTINE 21 MG/24HR TD PT24: 21.0000 mg | MEDICATED_PATCH | Freq: Every day | TRANSDERMAL | 0 refills | Status: DC

## 2021-10-26 MED ORDER — FLUTICASONE-SALMETEROL 250-50 MCG/DOSE IN AEPB: 1.0000 | INHALATION_SPRAY | Freq: Two times a day (BID) | RESPIRATORY_TRACT | 2 refills | Status: DC

## 2021-10-26 MED ORDER — ALBUTEROL SULFATE (2.5 MG/3ML) 0.083% IN NEBU
10.0000 mg | INHALATION_SOLUTION | Freq: Once | RESPIRATORY_TRACT | Status: AC
  Administered 2021-10-26: 10 mg via RESPIRATORY_TRACT
  Filled 2021-10-26: qty 12

## 2021-10-26 MED ORDER — PROMETHAZINE-DM 6.25-15 MG/5ML PO SYRP: 5.0000 mL | ORAL_SOLUTION | Freq: Every evening | ORAL | 0 refills | Status: DC | PRN

## 2021-10-26 MED ORDER — DEXAMETHASONE SODIUM PHOSPHATE 10 MG/ML IJ SOLN
10.0000 mg | Freq: Once | INTRAMUSCULAR | Status: AC
  Administered 2021-10-26: 10 mg via INTRAMUSCULAR

## 2021-10-26 MED ORDER — BUDESONIDE-FORMOTEROL FUMARATE 160-4.5 MCG/ACT IN AERO: 2.0000 | INHALATION_SPRAY | Freq: Two times a day (BID) | RESPIRATORY_TRACT | 3 refills | Status: DC

## 2021-10-26 MED ORDER — DOXYCYCLINE CALCIUM 50 MG/5ML PO SYRP: 100.0000 mg | ORAL_SOLUTION | Freq: Two times a day (BID) | ORAL | 0 refills | Status: AC

## 2021-10-26 MED ORDER — ALBUTEROL SULFATE HFA 108 (90 BASE) MCG/ACT IN AERS: 2.0000 | INHALATION_SPRAY | RESPIRATORY_TRACT | 3 refills | Status: DC | PRN

## 2021-10-26 MED ORDER — AZITHROMYCIN 200 MG/5ML PO SUSR: ORAL | 0 refills | Status: DC

## 2021-10-26 MED ORDER — BENZONATATE 100 MG PO CAPS: 100.0000 mg | ORAL_CAPSULE | Freq: Three times a day (TID) | ORAL | 0 refills | Status: DC

## 2021-10-26 NOTE — ED Notes (Signed)
Pt ambulated to restroom at this time obtained a urine sample its at bedside . Lacey May
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
## 2021-10-26 NOTE — ED Triage Notes (Signed)
Symptoms x 1 weeks, cough, congestion.  States she had symptoms earlier in the month.  Felt better for 2 days and symptoms returned.  Home covid test was negative.

## 2021-10-26 NOTE — ED Provider Notes (Signed)
[facility] EMERGENCY DEPARTMENT Provider Note   CSN: [id number] Arrival date & time: 10/26/21  2010     History  Chief Complaint  Patient presents with   Cough    Lacey May is a 51 y.o. female.   Cough   51 y/o female - with hx of COPD - still smoking "more than I should" daily - she has presented to the hospital with increasing shortness of breath.  States that she has recently been ill complaining of coughing which is been going on intermittently since Christmas, has been seen at urgent care, treated with antibiotics, treated with steroids, treated with albuterol, she does not like to take steroids because of a prior history of stomach ulcers so she has not been compliant with her oral prednisone.  She went back to the urgent care today because her symptoms worsened after feeling like she was doing better last week, she was wheezing and was treated with intramuscular Decadron, nebulized treatments and oral antibiotics to treat for possible sinusitis.  She comes back today after using her albuterol Combivent and Advair stating that she feels palpitations today when she walks and still has coughing and wheezing which is frustrating for her.  No fevers, no swelling, no vomiting or diarrhea  Home Medications Prior to Admission medications   Medication Sig Start Date End Date Taking? Authorizing Provider  albuterol (VENTOLIN HFA) 108 (90 Base) MCG/ACT inhaler Inhale 2 puffs into the lungs every 4 (four) hours as needed for wheezing or shortness of breath. 10/26/21  Yes Noemi Chapel, MD  benzonatate (TESSALON) 100 MG capsule Take 1 capsule (100 mg total) by mouth every 8 (eight) hours. 10/26/21  Yes Noemi Chapel, MD  nicotine (NICODERM CQ - DOSED IN MG/24 HOURS) 21 mg/24hr patch Place 1 patch (21 mg total) onto the skin daily. 10/26/21  Yes Noemi Chapel, MD  atorvastatin (LIPITOR) 10 MG tablet Take 1 tablet by mouth at bedtime. 06/06/19   [provider]  azithromycin  (ZITHROMAX) 200 MG/5ML suspension Take 12.6 mL day one, then 6.3 mL daily for 4 more days 10/26/21   Volney American, PA-C  budesonide-formoterol [facility]) 160-4.5 MCG/ACT inhaler Inhale 2 puffs into the lungs 2 (two) times daily. 10/26/21   Volney American, PA-C  cyanocobalamin 1000 MCG tablet Take 1,000 mcg by mouth 2 (two) times daily.    [provider]  doxycycline (VIBRAMYCIN) 50 MG/5ML SYRP Take 10 mLs (100 mg total) by mouth 2 (two) times daily for 10 days. 10/26/21 11/05/21  Volney American, PA-C  ferrous sulfate 325 (65 FE) MG EC tablet Take 1 tablet (325 mg total) by mouth 2 (two) times daily. 03/21/19 03/20/20  Saundra Shelling, MD  Fluticasone-Salmeterol (ADVAIR) 250-50 MCG/DOSE AEPB Inhale 1 puff into the lungs in the morning and at bedtime. 10/26/21   Volney American, PA-C  furosemide (LASIX) 20 MG tablet Take 20 mg by mouth daily. 03/18/19   [provider]  gabapentin (NEURONTIN) 300 MG capsule Take 900 mg by mouth 3 (three) times daily.  11/17/14   [provider]  Ipratropium-Albuterol (COMBIVENT) 20-100 MCG/ACT AERS respimat Inhale 1 puff into the lungs every 6 (six) hours. 06/26/20   Wurst, [place], PA-C  mometasone (NASONEX) 50 MCG/ACT nasal spray Place 2 sprays into the nose daily.    [provider]  montelukast (SINGULAIR) 10 MG tablet Take 10 mg by mouth at bedtime.    [provider]  Multiple Vitamin (MULTIVITAMIN) tablet Take 1 tablet by  mouth daily.    [provider]  ondansetron (ZOFRAN-ODT) 4 MG disintegrating tablet Take 1 tablet (4 mg total) by mouth every 8 (eight) hours as needed for nausea or vomiting. 01/04/21   Vanessa Kick, MD  pantoprazole (PROTONIX) 40 MG tablet Take 1 tablet (40 mg total) by mouth 2 (two) times daily for 30 days. 03/21/19 04/20/19  Saundra Shelling, MD  predniSONE (DELTASONE) 20 MG tablet Take 2 tablets daily with breakfast. 10/09/21   Jaynee Eagles, PA-C  promethazine  (PHENERGAN) 25 MG tablet Take 25 mg by mouth every 6 (six) hours as needed.  12/11/11   [provider]  promethazine-dextromethorphan (PROMETHAZINE-DM) 6.25-15 MG/5ML syrup Take 5 mLs by mouth at bedtime as needed for cough. 10/26/21   Volney American, PA-C  sucralfate (CARAFATE) 1 g tablet Take by mouth. 03/31/19 03/30/20  [provider]      Allergies    Penicillins, Sertraline, Clonidine, Nefazodone, Nsaids, and Benzoyl peroxide    Review of Systems   Review of Systems  Respiratory:  Positive for cough.   All other systems reviewed and are negative.  Physical Exam Updated Vital Signs BP (!) 166/97 (BP Location: Right Arm)    Pulse (!) 104    Temp 98.2 F (36.8 C)    Ht 1.626 m (5\' 4" )    Wt 49 kg    SpO2 98%    BMI 18.54 kg/m  Physical Exam Vitals and nursing note reviewed.  Constitutional:      General: She is not in acute distress.    Appearance: She is well-developed.  HENT:     Head: Normocephalic and atraumatic.     Mouth/Throat:     Pharynx: No oropharyngeal exudate.  Eyes:     General: No scleral icterus.       Right eye: No discharge.        Left eye: No discharge.     Conjunctiva/sclera: Conjunctivae normal.     Pupils: Pupils are equal, round, and reactive to light.  Neck:     Thyroid: No thyromegaly.     Vascular: No JVD.  Cardiovascular:     Rate and Rhythm: Regular rhythm. Tachycardia present.     Heart sounds: Normal heart sounds. No murmur heard.   No friction rub. No gallop.     Comments: Pulse of 105 Pulmonary:     Effort: Respiratory distress present.     Breath sounds: Wheezing present. No rales.  Abdominal:     General: Bowel sounds are normal. There is no distension.     Palpations: Abdomen is soft. There is no mass.     Tenderness: There is no abdominal tenderness.  Musculoskeletal:        General: No tenderness. Normal range of motion.     Cervical back: Normal range of motion and neck supple.     Right lower leg: No  edema.     Left lower leg: No edema.  Lymphadenopathy:     Cervical: No cervical adenopathy.  Skin:    General: Skin is warm and dry.     Findings: No erythema or rash.  Neurological:     Mental Status: She is alert.     Coordination: Coordination normal.  Psychiatric:        Behavior: Behavior normal.    ED Results / Procedures / Treatments   Labs (all labs ordered are listed, but only abnormal results are displayed) Labs Reviewed - No data to display  EKG None  Radiology DG Chest Port 1 View  Result Date: 10/26/2021 CLINICAL DATA:  Cough x2 weeks. EXAM: PORTABLE CHEST 1 VIEW COMPARISON:  March 20, 2019 FINDINGS: The heart size and mediastinal contours are within normal limits. There is no evidence of acute infiltrate, pleural effusion or pneumothorax. The visualized skeletal structures are unremarkable. IMPRESSION: No active cardiopulmonary disease. Electronically Signed   By: Virgina Norfolk M.D.   On: 10/26/2021 21:20    Procedures Procedures    Medications Ordered in ED Medications  albuterol (PROVENTIL,VENTOLIN) solution continuous neb (has no administration in time range)  albuterol (PROVENTIL) (2.5 MG/3ML) 0.083% nebulizer solution 10 mg (10 mg Nebulization Given 10/26/21 2058)    ED Course/ Medical Decision Making/ A&P                           Medical Decision Making The patient has ongoing respiratory symptoms consistent with COPD exacerbation likely exacerbated by underlying viral illness, she is not hypoxic, her tachycardia has improved when she is resting, she is wheezing diffusely but able to speak in full sentences, will give albuterol treatments and check a chest x-ray to make sure there is no pneumonia.  The patient is agreeable to the plan.  Would also consider potential pneumothorax and much less likely to be pulmonary embolism  Problems Addressed: COPD exacerbation ([place]): acute illness or injury    Details: Acute COPD exacerbation, presented with  increased work of breathing wheezing tightness coughing however after multiple albuterol treatments over the hour the patient has improved significantly and appears stable for discharge with a negative x-ray  Amount and/or Complexity of Data Reviewed External Data Reviewed: labs and notes.    Details: From urgent care visit earlier in the day Labs: ordered.    Details: My interpretation shows no acute findings Radiology: ordered and independent interpretation performed. Discussion of management or test interpretation with external provider(s): Patient received continuous albuterol treatment, appears significantly better, on repeat exam has much more clear lungs and is very stable for discharge, she is agreeable to the plan and wants to stop smoking after I have given her tobacco cessation counseling.  Nicotine patch prescribed  Risk OTC drugs. Prescription drug management. Risk Details: Considered admission however the patient improved significantly and does not want to be admitted which I think is reasonable           Final Clinical Impression(s) / ED Diagnoses Final diagnoses:  COPD exacerbation ([place])    Rx / DC Orders ED Discharge Orders          Ordered    albuterol (VENTOLIN HFA) 108 (90 Base) MCG/ACT inhaler  Every 4 hours PRN        10/26/21 2243    benzonatate (TESSALON) 100 MG capsule  Every 8 hours        10/26/21 2243    nicotine (NICODERM CQ - DOSED IN MG/24 HOURS) 21 mg/24hr patch  Daily        10/26/21 2243              Noemi Chapel, MD 10/26/21 2245

## 2021-10-26 NOTE — ED Triage Notes (Signed)
Pt to ED by POV from home with c/o nonproductive cough which began 2 weeks ago. Pt has been seen twice in the past couple of weeks for the same. She was diagnosed with a sinus infection and copd exacerbation this morning at [facility]. Arrives with elevated BP, all other VSS, RA SAT is 100%.

## 2021-10-26 NOTE — Discharge Instructions (Signed)
Please take albuterol, 2 puffs every 4 hours as needed for shortness of breath or wheezing, Tessalon for coughing every 8 hours  ER for severe worsening symptoms  Please stop smoking

## 2021-10-27 DIAGNOSIS — R0602 Shortness of breath: Secondary | ICD-10-CM | POA: Diagnosis not present

## 2021-10-27 DIAGNOSIS — I10 Essential (primary) hypertension: Secondary | ICD-10-CM | POA: Diagnosis not present

## 2021-10-27 DIAGNOSIS — Z20822 Contact with and (suspected) exposure to covid-19: Secondary | ICD-10-CM | POA: Diagnosis not present

## 2021-10-27 DIAGNOSIS — J441 Chronic obstructive pulmonary disease with (acute) exacerbation: Secondary | ICD-10-CM | POA: Diagnosis not present

## 2021-10-27 LAB — COVID-19, FLU A+B NAA
Influenza A, NAA: NOT DETECTED
Influenza B, NAA: NOT DETECTED
SARS-CoV-2, NAA: NOT DETECTED

## 2021-10-28 DIAGNOSIS — M5416 Radiculopathy, lumbar region: Secondary | ICD-10-CM | POA: Insufficient documentation

## 2021-11-02 DIAGNOSIS — I10 Essential (primary) hypertension: Secondary | ICD-10-CM | POA: Diagnosis not present

## 2021-11-02 DIAGNOSIS — Z794 Long term (current) use of insulin: Secondary | ICD-10-CM | POA: Diagnosis not present

## 2021-11-02 DIAGNOSIS — F17200 Nicotine dependence, unspecified, uncomplicated: Secondary | ICD-10-CM | POA: Diagnosis not present

## 2021-11-02 DIAGNOSIS — R052 Subacute cough: Secondary | ICD-10-CM | POA: Diagnosis not present

## 2021-11-02 DIAGNOSIS — J441 Chronic obstructive pulmonary disease with (acute) exacerbation: Secondary | ICD-10-CM | POA: Diagnosis not present

## 2021-11-02 DIAGNOSIS — E1165 Type 2 diabetes mellitus with hyperglycemia: Secondary | ICD-10-CM | POA: Diagnosis not present

## 2021-11-02 DIAGNOSIS — R7309 Other abnormal glucose: Secondary | ICD-10-CM | POA: Diagnosis not present

## 2021-11-02 DIAGNOSIS — Z136 Encounter for screening for cardiovascular disorders: Secondary | ICD-10-CM | POA: Diagnosis not present

## 2021-11-02 DIAGNOSIS — Z8719 Personal history of other diseases of the digestive system: Secondary | ICD-10-CM | POA: Diagnosis not present

## 2021-11-08 ENCOUNTER — Ambulatory Visit: Payer: Self-pay | Admitting: Gastroenterology

## 2021-11-08 ENCOUNTER — Other Ambulatory Visit: Payer: Self-pay

## 2021-11-08 DIAGNOSIS — Z419 Encounter for procedure for purposes other than remedying health state, unspecified: Secondary | ICD-10-CM | POA: Diagnosis not present

## 2021-11-14 DIAGNOSIS — F419 Anxiety disorder, unspecified: Secondary | ICD-10-CM | POA: Diagnosis not present

## 2021-11-16 DIAGNOSIS — E1165 Type 2 diabetes mellitus with hyperglycemia: Secondary | ICD-10-CM | POA: Diagnosis not present

## 2021-11-16 DIAGNOSIS — I10 Essential (primary) hypertension: Secondary | ICD-10-CM | POA: Diagnosis not present

## 2021-11-16 DIAGNOSIS — M5416 Radiculopathy, lumbar region: Secondary | ICD-10-CM | POA: Diagnosis not present

## 2021-11-16 DIAGNOSIS — E785 Hyperlipidemia, unspecified: Secondary | ICD-10-CM | POA: Insufficient documentation

## 2021-11-16 DIAGNOSIS — Z8719 Personal history of other diseases of the digestive system: Secondary | ICD-10-CM | POA: Insufficient documentation

## 2021-11-16 DIAGNOSIS — E1169 Type 2 diabetes mellitus with other specified complication: Secondary | ICD-10-CM | POA: Diagnosis not present

## 2021-11-28 ENCOUNTER — Ambulatory Visit: Payer: Medicaid Other | Admitting: Gastroenterology

## 2021-11-28 NOTE — Progress Notes (Deleted)
Jonathon Bellows MD, MRCP(U.K) [address]  [place]  [place], [place] [address]  Main: [phone]  Fax: [phone]   Primary Care Physician: [facility]  Primary Gastroenterologist:  Dr. Jonathon Bellows   No chief complaint on file.   HPI: Lacey May is a 51 y.o. female   Summary of history :  She has last been seen by myself back in 2020 as a hospital follow-up.  Admitted in June 2020 with a hemoglobin of 6.7 g.  Long-term usage of BC powders B12 was also low at that point of time she received a blood transfusion and I performed an EGD and saw a large quantity of blood in the stomach and a nonobstructing nonbleeding cratered gastric ulcer of significant severity with a clean base within the gastric fundus.  The plan at that point of time was to give her IV iron and repeat EGD in 8 to 12 weeks to check for healing of the ulcer obtain H. pylori breath test and at some point to get a colonoscopy.  She never repeated the upper endoscopy canceled her procedures did not show for future appointments.  Interval history   ***/***/2020-  ***/***/2020  ***   Current Outpatient Medications  Medication Sig Dispense Refill   ACCU-CHEK GUIDE test strip 3 (three) times daily.     Accu-Chek Softclix Lancets lancets 3 (three) times daily.     Acetaminophen 325 MG CAPS Take by mouth.     albuterol (VENTOLIN HFA) 108 (90 Base) MCG/ACT inhaler Inhale 2 puffs into the lungs every 4 (four) hours as needed for wheezing or shortness of breath. 1 each 3   atorvastatin (LIPITOR) 10 MG tablet Take by mouth.     azithromycin (ZITHROMAX) 200 MG/5ML suspension Take 12.6 mL day one, then 6.3 mL daily for 4 more days 37.8 mL 0   benzonatate (TESSALON) 100 MG capsule Take 1 capsule (100 mg total) by mouth every 8 (eight) hours. 21 capsule 0   budesonide (PULMICORT) 0.5 MG/2ML nebulizer solution Take by nebulization daily.     cetirizine (ZYRTEC) 10 MG tablet Take 1 tablet by mouth daily.      clonazePAM (KLONOPIN) 0.5 MG tablet Take by mouth.     cyanocobalamin 1000 MCG tablet Take 1,000 mcg by mouth 2 (two) times daily.     cyclobenzaprine (FLEXERIL) 10 MG tablet Take 1 tablet by mouth 2 (two) times daily as needed.     Cysteamine Bitartrate (PROCYSBI) 300 MG PACK Use 1 each 3 (three) times daily Use as instructed.     diclofenac Sodium (VOLTAREN) 1 % GEL Apply topically 2 (two) times daily.     diltiazem (TIAZAC) 240 MG 24 hr capsule Take by mouth.     DULoxetine (CYMBALTA) 60 MG capsule Take 60 mg by mouth daily.     esomeprazole (NEXIUM) 40 MG capsule Take by mouth.     ferrous sulfate 325 (65 FE) MG EC tablet Take 1 tablet (325 mg total) by mouth 2 (two) times daily. 60 tablet 3   furosemide (LASIX) 20 MG tablet Take 20 mg by mouth daily.     gabapentin (NEURONTIN) 300 MG capsule Take 900 mg by mouth 3 (three) times daily.      guaiFENesin-codeine 100-10 MG/5ML syrup Take 5 mLs by mouth every 6 (six) hours as needed.     Ipratropium-Albuterol (COMBIVENT) 20-100 MCG/ACT AERS respimat Inhale 1 puff into the lungs every 6 (six) hours. 4 g 0  lidocaine (XYLOCAINE) 5 % ointment Apply topically.     mometasone (NASONEX) 50 MCG/ACT nasal spray Place 2 sprays into the nose daily.     montelukast (SINGULAIR) 10 MG tablet Take 10 mg by mouth at bedtime.     Multiple Vitamin (MULTIVITAMIN) tablet Take 1 tablet by mouth daily.     nicotine (NICODERM CQ - DOSED IN MG/24 HOURS) 21 mg/24hr patch Place 1 patch (21 mg total) onto the skin daily. 28 patch 0   ondansetron (ZOFRAN-ODT) 4 MG disintegrating tablet Take 1 tablet (4 mg total) by mouth every 8 (eight) hours as needed for nausea or vomiting. 15 tablet 0   predniSONE (DELTASONE) 20 MG tablet Take 2 tablets daily with breakfast. 10 tablet 0   promethazine (PHENERGAN) 25 MG tablet Take 25 mg by mouth every 6 (six) hours as needed.      sucralfate (CARAFATE) 1 g tablet Take by mouth.     traMADol (ULTRAM) 50 MG tablet Take 50 mg by  mouth 2 (two) times daily as needed.     No current facility-administered medications for this visit.    Allergies as of 11/28/2021 - Review Complete 10/26/2021  Allergen Reaction Noted   Penicillins Anaphylaxis 02/02/2015   Sertraline Palpitations 10/13/2019   Clonidine Other (See Comments) 05/05/2015   Nefazodone Other (See Comments) 02/02/2015   Nsaids  08/23/2019   Benzoyl peroxide Other (See Comments) and Rash 02/02/2015    ROS:  General: Negative for anorexia, weight loss, fever, chills, fatigue, weakness. ENT: Negative for hoarseness, difficulty swallowing , nasal congestion. CV: Negative for chest pain, angina, palpitations, dyspnea on exertion, peripheral edema.  Respiratory: Negative for dyspnea at rest, dyspnea on exertion, cough, sputum, wheezing.  GI: See history of present illness. GU:  Negative for dysuria, hematuria, urinary incontinence, urinary frequency, nocturnal urination.  Endo: Negative for unusual weight change.    Physical Examination:   There were no vitals taken for this visit.  General: Well-nourished, well-developed in no acute distress.  Eyes: No icterus. Conjunctivae pink. Mouth: Oropharyngeal mucosa moist and pink , no lesions erythema or exudate. Lungs: Clear to auscultation bilaterally. Non-labored. Heart: Regular rate and rhythm, no murmurs rubs or gallops.  Abdomen: Bowel sounds are normal, nontender, nondistended, no hepatosplenomegaly or masses, no abdominal bruits or hernia , no rebound or guarding.   Extremities: No lower extremity edema. No clubbing or deformities. Neuro: Alert and oriented x 3.  Grossly intact. Skin: Warm and dry, no jaundice.   Psych: Alert and cooperative, normal mood and affect.   Imaging Studies: No results found.  Assessment and Plan:   Lacey May is a 51 y.o. y/o female ***    Dr Jonathon Bellows  MD,MRCP [facility]) Follow up in ***

## 2021-12-06 DIAGNOSIS — Z419 Encounter for procedure for purposes other than remedying health state, unspecified: Secondary | ICD-10-CM | POA: Diagnosis not present

## 2021-12-13 DIAGNOSIS — F419 Anxiety disorder, unspecified: Secondary | ICD-10-CM | POA: Diagnosis not present

## 2022-01-01 ENCOUNTER — Other Ambulatory Visit: Payer: Self-pay

## 2022-01-01 ENCOUNTER — Ambulatory Visit (INDEPENDENT_AMBULATORY_CARE_PROVIDER_SITE_OTHER): Payer: Medicaid Other

## 2022-01-01 ENCOUNTER — Ambulatory Visit
Admission: EM | Admit: 2022-01-01 | Discharge: 2022-01-01 | Disposition: A | Discharge status: Home / Self Care | Payer: Medicaid Other | Attending: Urgent Care | Admitting: Urgent Care

## 2022-01-01 DIAGNOSIS — R059 Cough, unspecified: Secondary | ICD-10-CM

## 2022-01-01 DIAGNOSIS — R0602 Shortness of breath: Secondary | ICD-10-CM

## 2022-01-01 DIAGNOSIS — J189 Pneumonia, unspecified organism: Secondary | ICD-10-CM | POA: Diagnosis not present

## 2022-01-01 DIAGNOSIS — M79662 Pain in left lower leg: Secondary | ICD-10-CM

## 2022-01-01 DIAGNOSIS — I10 Essential (primary) hypertension: Secondary | ICD-10-CM

## 2022-01-01 DIAGNOSIS — J449 Chronic obstructive pulmonary disease, unspecified: Secondary | ICD-10-CM

## 2022-01-01 MED ORDER — FUROSEMIDE 20 MG PO TABS
20.0000 mg | ORAL_TABLET | Freq: Every day | ORAL | 0 refills | Status: AC
Start: 2022-01-01 — End: ?

## 2022-01-01 MED ORDER — METHYLPREDNISOLONE SODIUM SUCC 125 MG IJ SOLR
125.0000 mg | Freq: Once | INTRAMUSCULAR | Status: AC
  Administered 2022-01-01: 125 mg via INTRAMUSCULAR

## 2022-01-01 MED ORDER — LEVOFLOXACIN 750 MG PO TABS: 750.0000 mg | ORAL_TABLET | Freq: Every day | ORAL | 0 refills | Status: DC

## 2022-01-01 NOTE — ED Provider Notes (Signed)
?[place] ? ? ?MRN: [id number] DOB: [birth year] ? ?Subjective:  ? ?Lacey May is a 51 y.o. female presenting for 2-week history of persistent cough, shortness of breath.  She is also had bilateral lower leg swelling worse on the left.  The left side is also painful.  She has a history of COPD and asthma.  Has a history of a GI bleed from taking steroids long-term for her MS.  She has not taken this for a while.  Typically she responds better she gets a steroid injection.  She does report a history of heart failure but this diagnosis is not established, I cannot track down her formal diagnosis.  No history of CKD.  No history of PE, DVT. ? ?No current facility-administered medications for this encounter. ? ?Current Outpatient Medications:  ?  ACCU-CHEK GUIDE test strip, 3 (three) times daily., Disp: , Rfl:  ?  Accu-Chek Softclix Lancets lancets, 3 (three) times daily., Disp: , Rfl:  ?  Acetaminophen 325 MG CAPS, Take by mouth., Disp: , Rfl:  ?  albuterol (VENTOLIN HFA) 108 (90 Base) MCG/ACT inhaler, Inhale 2 puffs into the lungs every 4 (four) hours as needed for wheezing or shortness of breath., Disp: 1 each, Rfl: 3 ?  atorvastatin (LIPITOR) 10 MG tablet, Take by mouth., Disp: , Rfl:  ?  azithromycin (ZITHROMAX) 200 MG/5ML suspension, Take 12.6 mL day one, then 6.3 mL daily for 4 more days, Disp: 37.8 mL, Rfl: 0 ?  benzonatate (TESSALON) 100 MG capsule, Take 1 capsule (100 mg total) by mouth every 8 (eight) hours., Disp: 21 capsule, Rfl: 0 ?  budesonide (PULMICORT) 0.5 MG/2ML nebulizer solution, Take by nebulization daily., Disp: , Rfl:  ?  cetirizine (ZYRTEC) 10 MG tablet, Take 1 tablet by mouth daily., Disp: , Rfl:  ?  clonazePAM (KLONOPIN) 0.5 MG tablet, Take by mouth., Disp: , Rfl:  ?  cyanocobalamin 1000 MCG tablet, Take 1,000 mcg by mouth 2 (two) times daily., Disp: , Rfl:  ?  cyclobenzaprine (FLEXERIL) 10 MG tablet, Take 1 tablet by mouth 2 (two) times daily as needed., Disp: , Rfl:  ?   Cysteamine Bitartrate (PROCYSBI) 300 MG PACK, Use 1 each 3 (three) times daily Use as instructed., Disp: , Rfl:  ?  diclofenac Sodium (VOLTAREN) 1 % GEL, Apply topically 2 (two) times daily., Disp: , Rfl:  ?  diltiazem (TIAZAC) 240 MG 24 hr capsule, Take by mouth., Disp: , Rfl:  ?  DULoxetine (CYMBALTA) 60 MG capsule, Take 60 mg by mouth daily., Disp: , Rfl:  ?  esomeprazole (NEXIUM) 40 MG capsule, Take by mouth., Disp: , Rfl:  ?  ferrous sulfate 325 (65 FE) MG EC tablet, Take 1 tablet (325 mg total) by mouth 2 (two) times daily., Disp: 60 tablet, Rfl: 3 ?  furosemide (LASIX) 20 MG tablet, Take 20 mg by mouth daily., Disp: , Rfl:  ?  gabapentin (NEURONTIN) 300 MG capsule, Take 900 mg by mouth 3 (three) times daily. , Disp: , Rfl:  ?  guaiFENesin-codeine 100-10 MG/5ML syrup, Take 5 mLs by mouth every 6 (six) hours as needed., Disp: , Rfl:  ?  Ipratropium-Albuterol (COMBIVENT) 20-100 MCG/ACT AERS respimat, Inhale 1 puff into the lungs every 6 (six) hours., Disp: 4 g, Rfl: 0 ?  lidocaine (XYLOCAINE) 5 % ointment, Apply topically., Disp: , Rfl:  ?  mometasone (NASONEX) 50 MCG/ACT nasal spray, Place 2 sprays into the nose daily., Disp: , Rfl:  ?  montelukast (SINGULAIR) 10 MG  tablet, Take 10 mg by mouth at bedtime., Disp: , Rfl:  ?  Multiple Vitamin (MULTIVITAMIN) tablet, Take 1 tablet by mouth daily., Disp: , Rfl:  ?  nicotine (NICODERM CQ - DOSED IN MG/24 HOURS) 21 mg/24hr patch, Place 1 patch (21 mg total) onto the skin daily., Disp: 28 patch, Rfl: 0 ?  ondansetron (ZOFRAN-ODT) 4 MG disintegrating tablet, Take 1 tablet (4 mg total) by mouth every 8 (eight) hours as needed for nausea or vomiting., Disp: 15 tablet, Rfl: 0 ?  predniSONE (DELTASONE) 20 MG tablet, Take 2 tablets daily with breakfast., Disp: 10 tablet, Rfl: 0 ?  promethazine (PHENERGAN) 25 MG tablet, Take 25 mg by mouth every 6 (six) hours as needed. , Disp: , Rfl:  ?  sucralfate (CARAFATE) 1 g tablet, Take by mouth., Disp: , Rfl:  ?  traMADol (ULTRAM) 50  MG tablet, Take 50 mg by mouth 2 (two) times daily as needed., Disp: , Rfl:   ? ?Allergies  ?Allergen Reactions  ? Penicillins Anaphylaxis  ?  Other reaction(s): SHORTNESS OF BREATH  ? Sertraline Palpitations  ? Clonidine Other (See Comments)  ?  confusion  ? Nefazodone Other (See Comments)  ?  Tachacardia ?Other reaction(s): OTHER  ? Nsaids   ?  Other reaction(s): Unknown ?Due to internal bleeding  ? Benzoyl Peroxide Other (See Comments) and Rash  ? ? ?Past Medical History:  ?Diagnosis Date  ? Asthma   ? COPD (chronic obstructive pulmonary disease) ([place])   ? Diabetes mellitus without complication ([place])   ? Fibromyalgia   ? Hypertension   ? MS (multiple sclerosis) ([place])   ? Restrictive airway disease   ? Thyroid disease   ?  ? ?Past Surgical History:  ?Procedure Laterality Date  ? ABDOMINAL HYSTERECTOMY    ? CESAREAN SECTION    ? ESOPHAGOGASTRODUODENOSCOPY (EGD) WITH PROPOFOL N/A 03/20/2019  ? Procedure: ESOPHAGOGASTRODUODENOSCOPY (EGD) WITH PROPOFOL;  Surgeon: Jonathon Bellows, MD;  Location: [facility] ENDOSCOPY;  Service: Gastroenterology;  Laterality: N/A;  ? NISSEN FUNDOPLICATION    ? ? ?Family History  ?Adopted: Yes  ?Problem Relation Age of Onset  ? Breast cancer Mother   ? Heart failure Father   ? Hypertension Father   ? Lupus Brother   ? Diabetes Brother   ? Breast cancer Maternal Aunt   ? Diabetes Maternal Grandmother   ? Diabetes Maternal Grandfather   ? Lung cancer Paternal Grandfather   ? ? ?Social History  ? ?Tobacco Use  ? Smoking status: Every Day  ?  Packs/day: 0.50  ?  Types: Cigarettes  ? Smokeless tobacco: Never  ?Vaping Use  ? Vaping Use: Never used  ?Substance Use Topics  ? Alcohol use: No  ?  Alcohol/week: 0.0 standard drinks  ? Drug use: No  ? ? ?ROS ? ? ?Objective:  ? ?Vitals: ?BP 138/87   Pulse 69   Temp (!) 97.2 ?F (36.2 ?C)   Resp 18   SpO2 98%  ? ?Physical Exam ?Constitutional:   ?   General: She is not in acute distress. ?   Appearance: Normal appearance. She is well-developed. She is not  ill-appearing, toxic-appearing or diaphoretic.  ?HENT:  ?   Head: Normocephalic and atraumatic.  ?   Nose: Nose normal.  ?   Mouth/Throat:  ?   Mouth: Mucous membranes are moist.  ?Eyes:  ?   General: No scleral icterus.    ?   Right eye: No discharge.     ?   Left  eye: No discharge.  ?   Extraocular Movements: Extraocular movements intact.  ?Cardiovascular:  ?   Rate and Rhythm: Normal rate.  ?   Heart sounds: No murmur heard. ?  No friction rub. No gallop.  ?Pulmonary:  ?   Effort: Pulmonary effort is normal. No respiratory distress.  ?   Breath sounds: No stridor. No wheezing, rhonchi or rales.  ?Chest:  ?   Chest wall: No tenderness.  ?Musculoskeletal:     ?   General: Swelling (bilateral lower legs worse on the left) present.  ?   Right lower leg: No edema.  ?   Left lower leg: No edema.  ?Skin: ?   General: Skin is warm and dry.  ?Neurological:  ?   General: No focal deficit present.  ?   Mental Status: She is alert and oriented to person, place, and time.  ?Psychiatric:     ?   Mood and Affect: Mood normal.     ?   Behavior: Behavior normal.     ?   Thought Content: Thought content normal.     ?   Judgment: Judgment normal.  ? ? ?DG Chest 2 View ? ?Result Date: 01/01/2022 ?CLINICAL DATA:  Shortness of breath. Cough with bilateral leg swelling for 1 week. EXAM: CHEST - 2 VIEW COMPARISON:  AP chest 10/26/2021, 03/20/2019, 03/16/2019, 01/28/2019 FINDINGS: Cardiac silhouette and mediastinal contours are within normal limits. There is again flattening of the diaphragms and moderate to high-grade hyperinflation. Increased right-greater-than-left perihilar heterogeneous opacities. No pleural effusion or pneumothorax. Minimal dextrocurvature of the mid to lower thoracic spine with mild multilevel degenerative disc changes. IMPRESSION:: IMPRESSION: 1. Chronic emphysema. 2. Increased right-greater-than-left perihilar heterogeneous airspace opacities. This may be secondary to pneumonia. Somewhat midlung localized  bilateral pulmonary edema in the setting of cystic emphysema is also possible. Recommend follow-up radiographs 6 weeks after treatment to ensure resolution. Electronically Signed   By: Yvonne Kendall M.D.   On: 03/27/2

## 2022-01-01 NOTE — ED Triage Notes (Signed)
Pt presents with c/o bilateral leg swelling for past couple weeks, left is worse than right , pt also reports hypertension  ?

## 2022-01-06 DIAGNOSIS — Z419 Encounter for procedure for purposes other than remedying health state, unspecified: Secondary | ICD-10-CM | POA: Diagnosis not present

## 2022-02-05 ENCOUNTER — Other Ambulatory Visit: Payer: Self-pay | Admitting: Family Medicine

## 2022-02-05 DIAGNOSIS — Z419 Encounter for procedure for purposes other than remedying health state, unspecified: Secondary | ICD-10-CM | POA: Diagnosis not present

## 2022-02-08 ENCOUNTER — Other Ambulatory Visit: Payer: Self-pay

## 2022-02-08 ENCOUNTER — Ambulatory Visit: Payer: Medicaid Other | Admitting: Gastroenterology

## 2022-02-12 ENCOUNTER — Ambulatory Visit
Admission: EM | Admit: 2022-02-12 | Discharge: 2022-02-12 | Disposition: A | Discharge status: Home / Self Care | Payer: Medicaid Other | Attending: Nurse Practitioner | Admitting: Nurse Practitioner

## 2022-02-12 DIAGNOSIS — R112 Nausea with vomiting, unspecified: Secondary | ICD-10-CM

## 2022-02-12 MED ORDER — ONDANSETRON HCL 4 MG/2ML IJ SOLN
4.0000 mg | Freq: Once | INTRAMUSCULAR | Status: AC
  Administered 2022-02-12: 4 mg via INTRAMUSCULAR

## 2022-02-12 MED ORDER — ONDANSETRON 4 MG PO TBDP: 4.0000 mg | ORAL_TABLET | Freq: Three times a day (TID) | ORAL | 0 refills | Status: AC | PRN
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
## 2022-02-12 NOTE — Discharge Instructions (Addendum)
Take medication as prescribed. ?As discussed, you need to perform a step up diet until you can keep foods down.  Recommend continuing ice chips today.  If you are able to keep those down, recommend a brat diet, bananas, rice, applesauce, toast.  You are able to tolerate a brat diet, eat a soft diet next to include soft vegetables, fish, puddings or yogurt. ?May take Tylenol as needed for pain or fever. ?If you are unable to keep fluids or liquids down after taking medication, go to the ER. ?Follow-up as needed. ?

## 2022-02-12 NOTE — ED Triage Notes (Signed)
Pt reports vomiting, headache and fatigue x 3 days. Tylenol gives no relief.  ? ?Pt had a negative home COVID test on 02/11/2022. ?

## 2022-02-17 ENCOUNTER — Ambulatory Visit (INDEPENDENT_AMBULATORY_CARE_PROVIDER_SITE_OTHER): Payer: Medicaid Other

## 2022-02-17 ENCOUNTER — Ambulatory Visit
Admission: RE | Admit: 2022-02-17 | Discharge: 2022-02-17 | Disposition: A | Discharge status: Home / Self Care | Payer: Medicaid Other | Source: Ambulatory Visit | Attending: Family Medicine | Admitting: Family Medicine

## 2022-02-17 VITALS — BP 103/68 | HR 78 | Temp 98.1°F | Resp 18

## 2022-02-17 DIAGNOSIS — R0602 Shortness of breath: Secondary | ICD-10-CM

## 2022-02-17 DIAGNOSIS — R059 Cough, unspecified: Secondary | ICD-10-CM | POA: Diagnosis not present

## 2022-02-17 DIAGNOSIS — R911 Solitary pulmonary nodule: Secondary | ICD-10-CM

## 2022-02-17 DIAGNOSIS — J441 Chronic obstructive pulmonary disease with (acute) exacerbation: Secondary | ICD-10-CM | POA: Diagnosis not present

## 2022-02-17 MED ORDER — DEXAMETHASONE SODIUM PHOSPHATE 10 MG/ML IJ SOLN
10.0000 mg | Freq: Once | INTRAMUSCULAR | Status: AC
Start: 2022-02-17 — End: 2022-02-17
  Administered 2022-02-17: 10 mg via INTRAMUSCULAR

## 2022-02-17 MED ORDER — PREDNISOLONE 15 MG/5ML PO SOLN: 50.0000 mg | Freq: Every day | ORAL | 0 refills | Status: AC

## 2022-02-17 MED ORDER — AZITHROMYCIN 200 MG/5ML PO SUSR: ORAL | 0 refills | Status: DC

## 2022-02-17 MED ORDER — BUDESONIDE 0.5 MG/2ML IN SUSP: 0.5000 mg | Freq: Every day | RESPIRATORY_TRACT | 1 refills | Status: DC

## 2022-02-17 MED ORDER — IPRATROPIUM-ALBUTEROL 20-100 MCG/ACT IN AERS: 1.0000 | INHALATION_SPRAY | Freq: Four times a day (QID) | RESPIRATORY_TRACT | 2 refills | Status: DC

## 2022-02-17 NOTE — ED Triage Notes (Signed)
Cough and SOB x 2 days.  States inhaler is not helping much ?
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
## 2022-02-17 NOTE — Discharge Instructions (Signed)
Follow-up first thing next week with primary care to recheck your symptoms and discuss the inconclusive findings of your chest x-ray ?

## 2022-02-19 DIAGNOSIS — J439 Emphysema, unspecified: Secondary | ICD-10-CM | POA: Diagnosis not present

## 2022-03-08 DIAGNOSIS — Z419 Encounter for procedure for purposes other than remedying health state, unspecified: Secondary | ICD-10-CM | POA: Diagnosis not present

## 2022-03-15 DIAGNOSIS — Z23 Encounter for immunization: Secondary | ICD-10-CM | POA: Diagnosis not present

## 2022-03-15 DIAGNOSIS — M797 Fibromyalgia: Secondary | ICD-10-CM | POA: Diagnosis not present

## 2022-03-15 DIAGNOSIS — G894 Chronic pain syndrome: Secondary | ICD-10-CM | POA: Diagnosis not present

## 2022-03-15 DIAGNOSIS — M47816 Spondylosis without myelopathy or radiculopathy, lumbar region: Secondary | ICD-10-CM | POA: Diagnosis not present

## 2022-03-29 ENCOUNTER — Institutional Professional Consult (permissible substitution): Payer: Medicaid Other | Admitting: Internal Medicine

## 2022-04-07 DIAGNOSIS — Z419 Encounter for procedure for purposes other than remedying health state, unspecified: Secondary | ICD-10-CM | POA: Diagnosis not present

## 2022-04-17 ENCOUNTER — Institutional Professional Consult (permissible substitution): Payer: Medicaid Other | Admitting: Internal Medicine

## 2022-04-17 DIAGNOSIS — Z8742 Personal history of other diseases of the female genital tract: Secondary | ICD-10-CM | POA: Diagnosis not present

## 2022-04-17 DIAGNOSIS — F419 Anxiety disorder, unspecified: Secondary | ICD-10-CM | POA: Diagnosis not present

## 2022-04-17 DIAGNOSIS — M47816 Spondylosis without myelopathy or radiculopathy, lumbar region: Secondary | ICD-10-CM | POA: Diagnosis not present

## 2022-04-17 DIAGNOSIS — G894 Chronic pain syndrome: Secondary | ICD-10-CM | POA: Diagnosis not present

## 2022-04-17 DIAGNOSIS — M797 Fibromyalgia: Secondary | ICD-10-CM | POA: Diagnosis not present

## 2022-05-08 DIAGNOSIS — Z419 Encounter for procedure for purposes other than remedying health state, unspecified: Secondary | ICD-10-CM | POA: Diagnosis not present

## 2022-06-06 ENCOUNTER — Institutional Professional Consult (permissible substitution): Payer: Medicaid Other | Admitting: Internal Medicine

## 2022-06-06 NOTE — Progress Notes (Deleted)
Lacey May, female    DOB: [birth year]    MRN: [id number]   Brief patient profile:  ***  yo*** *** referred to pulmonary clinic in [place]  06/06/2022 by *** for ***      History of Present Illness  06/06/2022  Pulmonary/ 1st office eval/ Melvyn Novas / [place] [facility]  No chief complaint on file.    Dyspnea:  *** Cough: *** Sleep: *** SABA use:   Past Medical History:  Diagnosis Date   Asthma    COPD (chronic obstructive pulmonary disease) (H[facility])    Diabetes mellitus without complication ([facility])    Fibromyalgia    Hypertension    MS (multiple sclerosis) ([place])    Restrictive airway disease    Thyroid disease     Outpatient Medications Prior to Visit  Medication Sig Dispense Refill   Accu-Chek Softclix Lancets lancets 3 (three) times daily.     Acetaminophen 325 MG CAPS Take by mouth.     albuterol (VENTOLIN HFA) 108 (90 Base) MCG/ACT inhaler Inhale 2 puffs into the lungs every 4 (four) hours as needed for wheezing or shortness of breath. 1 each 3   ARIPiprazole (ABILIFY) 2 MG tablet Take 2 tablets by mouth daily.     atorvastatin (LIPITOR) 20 MG tablet Take 20 mg by mouth daily.     azithromycin (ZITHROMAX) 200 MG/5ML suspension Take 12.6 mL day one, then 6.3 mL daily for 4 more days 37.8 mL 0   benzonatate (TESSALON) 100 MG capsule Take 1 capsule (100 mg total) by mouth every 8 (eight) hours. 21 capsule 0   budesonide (PULMICORT) 0.5 MG/2ML nebulizer solution Take 2 mLs (0.5 mg total) by nebulization daily. 2 mL 1   cetirizine (ZYRTEC) 10 MG tablet Take 1 tablet by mouth daily.     clonazePAM (KLONOPIN) 0.5 MG tablet Take by mouth.     cyanocobalamin 1000 MCG tablet Take 1,000 mcg by mouth 2 (two) times daily.     cyclobenzaprine (FLEXERIL) 10 MG tablet Take 1 tablet by mouth 2 (two) times daily as needed.     Cysteamine Bitartrate (PROCYSBI) 300 MG PACK Use 1 each 3 (three) times daily Use as instructed.     diclofenac Sodium (VOLTAREN) 1 % GEL Apply topically 2 (two)  times daily.     diltiazem (TIAZAC) 240 MG 24 hr capsule Take by mouth.     DULoxetine (CYMBALTA) 60 MG capsule Take 60 mg by mouth daily.     esomeprazole (NEXIUM) 40 MG capsule Take by mouth.     ferrous sulfate 325 (65 FE) MG EC tablet Take 1 tablet (325 mg total) by mouth 2 (two) times daily. 60 tablet 3   furosemide (LASIX) 20 MG tablet Take 1 tablet (20 mg total) by mouth daily. 30 tablet 0   gabapentin (NEURONTIN) 400 MG capsule 1 capsule Orally twice a day     glucose blood test strip in the morning, at noon, and at bedtime.     guaiFENesin-codeine 100-10 MG/5ML syrup Take 5 mLs by mouth every 6 (six) hours as needed.     Ipratropium-Albuterol (COMBIVENT) 20-100 MCG/ACT AERS respimat Inhale 1 puff into the lungs every 6 (six) hours. 4 g 2   levofloxacin (LEVAQUIN) 750 MG tablet Take 1 tablet (750 mg total) by mouth daily. 7 tablet 0   lidocaine (LIDODERM) 5 % SMARTSIG:1 Patch(s) Topical     metFORMIN (GLUCOPHAGE) 1000 MG tablet Take 1,000 mg by mouth 2 (two) times daily.     mometasone (  NASONEX) 50 MCG/ACT nasal spray Place 2 sprays into the nose daily.     montelukast (SINGULAIR) 10 MG tablet Take 1 tablet by mouth daily.     Multiple Vitamin (MULTIVITAMIN) tablet Take 1 tablet by mouth daily.     nicotine (NICODERM CQ - DOSED IN MG/24 HOURS) 21 mg/24hr patch Place onto the skin.     nitroGLYCERIN (NITRODUR - DOSED IN MG/24 HR) 0.2 mg/hr patch PLACE 1 PATCH ONTO THE SKIN ONCE DAILY. LEAVE PATCHES ON FOR 12 TO 14 HOURS THEN REMOVE FOR 10 TO 12 HOURS. PRIOR TO APPLYING THE NEXT PATCH.     olmesartan (BENICAR) 40 MG tablet Take 40 mg by mouth daily.     ondansetron (ZOFRAN-ODT) 4 MG disintegrating tablet Take 1 tablet (4 mg total) by mouth every 8 (eight) hours as needed for nausea or vomiting. 20 tablet 0   pantoprazole (PROTONIX) 40 MG tablet Take by mouth.     predniSONE (DELTASONE) 20 MG tablet Take 2 tablets daily with breakfast. 10 tablet 0   promethazine (PHENERGAN) 25 MG tablet  Take 25 mg by mouth every 6 (six) hours as needed.      propranolol (INDERAL) 10 MG tablet Take 10 mg by mouth 2 (two) times daily.     rizatriptan (MAXALT) 10 MG tablet Take by mouth.     sucralfate (CARAFATE) 1 g tablet Take by mouth.     traMADol (ULTRAM) 50 MG tablet Take 50 mg by mouth 2 (two) times daily as needed.     WIXELA INHUB 250-50 MCG/ACT AEPB Inhale 1 puff into the lungs 2 (two) times daily.     No facility-administered medications prior to visit.     Objective:     There were no vitals taken for this visit.         Assessment   No problem-specific Assessment & Plan notes found for this encounter.     Christinia Gully, MD 06/06/2022

## 2022-06-08 DIAGNOSIS — Z419 Encounter for procedure for purposes other than remedying health state, unspecified: Secondary | ICD-10-CM | POA: Diagnosis not present

## 2022-07-08 DIAGNOSIS — Z419 Encounter for procedure for purposes other than remedying health state, unspecified: Secondary | ICD-10-CM | POA: Diagnosis not present

## 2022-08-08 DIAGNOSIS — Z419 Encounter for procedure for purposes other than remedying health state, unspecified: Secondary | ICD-10-CM | POA: Diagnosis not present

## 2022-08-16 ENCOUNTER — Other Ambulatory Visit: Payer: Self-pay | Admitting: Family Medicine

## 2022-08-31 ENCOUNTER — Ambulatory Visit
Admission: EM | Admit: 2022-08-31 | Discharge: 2022-08-31 | Disposition: A | Discharge status: Home / Self Care | Payer: Medicaid Other

## 2022-08-31 ENCOUNTER — Ambulatory Visit: Payer: Self-pay

## 2022-08-31 DIAGNOSIS — B349 Viral infection, unspecified: Secondary | ICD-10-CM

## 2022-08-31 NOTE — ED Triage Notes (Signed)
Pt reports cough, wheezing and vomiting x 5 days. NyQuil gives relief.

## 2022-08-31 NOTE — Discharge Instructions (Signed)
You most likely had a viral illness that is now resolving.  Make sure you continue drinking plenty of fluids and eat easy to digest foods.  This should improve toward the end of the weekend.  If symptoms suddenly worsen or you notice blood in your stool, go to the ER.
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
## 2022-09-04 DIAGNOSIS — G894 Chronic pain syndrome: Secondary | ICD-10-CM | POA: Diagnosis not present

## 2022-09-04 DIAGNOSIS — B349 Viral infection, unspecified: Secondary | ICD-10-CM | POA: Diagnosis not present

## 2022-09-04 DIAGNOSIS — M797 Fibromyalgia: Secondary | ICD-10-CM | POA: Diagnosis not present

## 2022-09-04 DIAGNOSIS — M47816 Spondylosis without myelopathy or radiculopathy, lumbar region: Secondary | ICD-10-CM | POA: Diagnosis not present

## 2022-09-07 ENCOUNTER — Ambulatory Visit: Payer: Self-pay

## 2022-09-07 DIAGNOSIS — Z419 Encounter for procedure for purposes other than remedying health state, unspecified: Secondary | ICD-10-CM | POA: Diagnosis not present

## 2022-09-12 DIAGNOSIS — J441 Chronic obstructive pulmonary disease with (acute) exacerbation: Secondary | ICD-10-CM | POA: Diagnosis not present

## 2022-09-13 ENCOUNTER — Institutional Professional Consult (permissible substitution) (HOSPITAL_BASED_OUTPATIENT_CLINIC_OR_DEPARTMENT_OTHER): Payer: Medicaid Other | Admitting: Pulmonary Disease

## 2022-09-14 ENCOUNTER — Institutional Professional Consult (permissible substitution) (HOSPITAL_BASED_OUTPATIENT_CLINIC_OR_DEPARTMENT_OTHER): Payer: Medicaid Other | Admitting: Pulmonary Disease

## 2022-09-14 DIAGNOSIS — G894 Chronic pain syndrome: Secondary | ICD-10-CM | POA: Diagnosis not present

## 2022-10-09 ENCOUNTER — Other Ambulatory Visit: Payer: Self-pay

## 2022-10-09 ENCOUNTER — Emergency Department: Payer: Medicaid Other

## 2022-10-09 ENCOUNTER — Encounter: Payer: Self-pay | Admitting: Emergency Medicine

## 2022-10-09 ENCOUNTER — Emergency Department (HOSPITAL_COMMUNITY)
Admission: EM | Admit: 2022-10-09 | Discharge: 2022-10-09 | Discharge status: Against Medical Advice | Payer: Medicaid Other | Source: Home / Self Care

## 2022-10-09 ENCOUNTER — Emergency Department
Admission: EM | Admit: 2022-10-09 | Discharge: 2022-10-09 | Disposition: A | Discharge status: Home / Self Care | Payer: Medicaid Other | Attending: Emergency Medicine | Admitting: Emergency Medicine

## 2022-10-09 DIAGNOSIS — Z20822 Contact with and (suspected) exposure to covid-19: Secondary | ICD-10-CM | POA: Diagnosis not present

## 2022-10-09 DIAGNOSIS — R0789 Other chest pain: Secondary | ICD-10-CM | POA: Diagnosis not present

## 2022-10-09 DIAGNOSIS — J441 Chronic obstructive pulmonary disease with (acute) exacerbation: Secondary | ICD-10-CM | POA: Diagnosis not present

## 2022-10-09 DIAGNOSIS — R079 Chest pain, unspecified: Secondary | ICD-10-CM

## 2022-10-09 DIAGNOSIS — J40 Bronchitis, not specified as acute or chronic: Secondary | ICD-10-CM | POA: Diagnosis not present

## 2022-10-09 DIAGNOSIS — R0602 Shortness of breath: Secondary | ICD-10-CM | POA: Diagnosis not present

## 2022-10-09 DIAGNOSIS — F17200 Nicotine dependence, unspecified, uncomplicated: Secondary | ICD-10-CM | POA: Insufficient documentation

## 2022-10-09 DIAGNOSIS — J209 Acute bronchitis, unspecified: Secondary | ICD-10-CM | POA: Insufficient documentation

## 2022-10-09 DIAGNOSIS — J45909 Unspecified asthma, uncomplicated: Secondary | ICD-10-CM | POA: Insufficient documentation

## 2022-10-09 DIAGNOSIS — J439 Emphysema, unspecified: Secondary | ICD-10-CM | POA: Diagnosis not present

## 2022-10-09 LAB — CBC
HCT: 41.6 % (ref 36.0–46.0)
Hemoglobin: 13.3 g/dL (ref 12.0–15.0)
MCH: 31.7 pg (ref 26.0–34.0)
MCHC: 32 g/dL (ref 30.0–36.0)
MCV: 99 fL (ref 80.0–100.0)
Platelets: 132 10*3/uL — ABNORMAL LOW (ref 150–400)
RBC: 4.2 MIL/uL (ref 3.87–5.11)
RDW: 13.8 % (ref 11.5–15.5)
WBC: 6.6 10*3/uL (ref 4.0–10.5)
nRBC: 0 % (ref 0.0–0.2)

## 2022-10-09 LAB — RESP PANEL BY RT-PCR (RSV, FLU A&B, COVID)  RVPGX2
Influenza A by PCR: NEGATIVE
Influenza B by PCR: NEGATIVE
Resp Syncytial Virus by PCR: NEGATIVE
SARS Coronavirus 2 by RT PCR: NEGATIVE

## 2022-10-09 LAB — BASIC METABOLIC PANEL
Anion gap: 7 (ref 5–15)
BUN: 9 mg/dL (ref 6–20)
CO2: 30 mmol/L (ref 22–32)
Calcium: 9.2 mg/dL (ref 8.9–10.3)
Chloride: 105 mmol/L (ref 98–111)
Creatinine, Ser: 0.98 mg/dL (ref 0.44–1.00)
GFR, Estimated: >60 mL/min (ref >60)
Glucose, Bld: 91 mg/dL (ref 70–99)
Potassium: 3.8 mmol/L (ref 3.5–5.1)
Sodium: 142 mmol/L (ref 135–145)

## 2022-10-09 LAB — TROPONIN I (HIGH SENSITIVITY): Troponin I (High Sensitivity): 2 ng/L (ref <18)

## 2022-10-09 MED ORDER — PREDNISONE 20 MG PO TABS
60.0000 mg | ORAL_TABLET | Freq: Once | ORAL | Status: AC
  Administered 2022-10-09: 60 mg via ORAL
  Filled 2022-10-09: qty 3

## 2022-10-09 MED ORDER — AZITHROMYCIN 250 MG PO TABS: ORAL_TABLET | ORAL | 0 refills | Status: AC

## 2022-10-09 MED ORDER — AZITHROMYCIN 500 MG PO TABS
500.0000 mg | ORAL_TABLET | Freq: Once | ORAL | Status: AC
Start: 2022-10-09 — End: 2022-10-09
  Administered 2022-10-09: 500 mg via ORAL
  Filled 2022-10-09: qty 1

## 2022-10-09 MED ORDER — LOSARTAN POTASSIUM 25 MG PO TABS: 25.0000 mg | ORAL_TABLET | Freq: Every day | ORAL | 5 refills | Status: AC

## 2022-10-09 MED ORDER — PREDNISONE 10 MG PO TABS: 10.0000 mg | ORAL_TABLET | Freq: Every day | ORAL | 0 refills | Status: DC

## 2022-10-09 NOTE — ED Triage Notes (Signed)
Pt to ED via POV from home. Pt reports SOB and CP that has been ongoing x1 wk but has gotten worse since last night. Pt reports hx COPD, MS, HTN and DM and took inhaler PTA. Pt reports hasn't been taking some of her meds due to insurance issues.

## 2022-10-09 NOTE — Discharge Instructions (Signed)
As we discussed please take your steroids and antibiotics as prescribed for their entire course.  Please follow-up with your doctor regarding your elevated blood pressure.  I have called in a new blood pressure medication losartan which should be more affordable.  I would recommend going to [facility] to have this filled as this is the least expensive pharmacy for this medication in the area.  Return to the emergency department for any worsening chest pain worsening trouble breathing or any other symptom personally concerning to yourself.

## 2022-10-09 NOTE — ED Provider Notes (Signed)
[facility] Provider Note    Event Date/Time   First MD Initiated Contact with Patient 10/09/22 1510     (approximate)  History   Chief Complaint: Chest Pain  HPI  Lacey May is a 52 y.o. female with a past medical history of asthma, COPD, fibromyalgia, MS, since the emergency department for shortness of breath.  Patient states a history of COPD, current smoker.  Patient states significant cough congestion and wheeze.  She is experiencing pain in the center of her chest.  States pain is worse with coughing.  No pleuritic pain.  No fever.  Physical Exam   Triage Vital Signs: ED Triage Vitals  Enc Vitals Group     BP 10/09/22 1317 (!) 164/107     Pulse Rate 10/09/22 1317 83     Resp 10/09/22 1317 18     Temp 10/09/22 1317 98.4 F (36.9 C)     Temp Source 10/09/22 1317 Oral     SpO2 10/09/22 1317 93 %     Weight 10/09/22 1415 108 lb 0.4 oz (49 kg)     Height 10/09/22 1415 '5\' 4"'$  (1.626 m)     Head Circumference --      Peak Flow --      Pain Score 10/09/22 1317 6     Pain Loc --      Pain Edu? --      Excl. in [place]? --     Most recent vital signs: Vitals:   10/09/22 1317  BP: (!) 164/107  Pulse: 83  Resp: 18  Temp: 98.4 F (36.9 C)  SpO2: 93%    General: Awake, no distress.  CV:  Good peripheral perfusion.  Regular rate and rhythm  Resp:  Normal effort.  Equal breath sounds bilaterally.  Moderate wheeze bilaterally.  Occasional cough.  No rhonchi. Abd:  No distention.  Soft, nontender.  No rebound or guarding. Other:  Lower extreme edema or tenderness.   ED Results / Procedures / Treatments   EKG  EKG viewed and interpreted by myself shows normal sinus rhythm at 79 bpm with a narrow QRS, normal axis, normal intervals, no concerning ST changes.  RADIOLOGY  I have reviewed and interpreted the chest x-ray images.  No consolidation seen on my evaluation. Radiology shows chronic emphysema but no acute process.   MEDICATIONS ORDERED  IN ED: Medications  predniSONE (DELTASONE) tablet 60 mg (60 mg Oral Given 10/09/22 1529)  azithromycin (ZITHROMAX) tablet 500 mg (500 mg Oral Given 10/09/22 1530)     IMPRESSION / MDM / ASSESSMENT AND PLAN / ED COURSE  I reviewed the triage vital signs and the nursing notes.  Patient's presentation is most consistent with acute presentation with potential threat to life or bodily function.  Patient presents emergency department for chest pain intermittent over the past week or 2.  Patient states increased cough and wheeze recently as well.  Overall the patient appears well, no distress.  Patient's workup in the emergency department is overall reassuring.  Satting in the mid 90s on room air.  Chemistry is normal, CBC is normal with a normal white blood cell count, troponin is reassuringly normal.  Patient is EKG and chest x-ray showed no concerning findings.  Given the patient's wheeze highly suspect COPD exacerbation possible acute bronchitis.  Will cover with antibiotics as well as a prednisone taper.  Patient has been out of her blood pressure medications due to finances.  I discussed with the patient tried  losartan as it is more affordable.  Patient agreeable to plan will follow-up with her primary care doctor.  FINAL CLINICAL IMPRESSION(S) / ED DIAGNOSES   chest pain COPD exacerbation Acute bronchitis  Note:  This document was prepared using Dragon voice recognition software and may include unintentional dictation errors.   Harvest Dark, MD 10/09/22 1625

## 2022-10-22 ENCOUNTER — Institutional Professional Consult (permissible substitution): Payer: Medicaid Other | Admitting: Internal Medicine

## 2022-10-22 NOTE — Progress Notes (Deleted)
Lacey May, female    DOB: [birth year]    MRN: [id number]   Brief patient profile:  ***  yo*** *** referred to pulmonary clinic in [place]  10/22/2022 by *** for ***      History of Present Illness  10/22/2022  Pulmonary/ 1st office eval/ [facility] Novas / [facility]  No chief complaint on file.    Dyspnea:  *** Cough: *** Sleep: *** SABA use: *** 02: *** Lung cancer screen: ***  Past Medical History:  Diagnosis Date   Asthma    COPD (chronic obstructive pulmonary disease) ([facility])    Diabetes mellitus without complication ([employer])    Fibromyalgia    Hypertension    MS (multiple sclerosis) ([place])    Restrictive airway disease    Thyroid disease     Outpatient Medications Prior to Visit  Medication Sig Dispense Refill   Accu-Chek Softclix Lancets lancets 3 (three) times daily.     Acetaminophen 325 MG CAPS Take by mouth.     albuterol (VENTOLIN HFA) 108 (90 Base) MCG/ACT inhaler Inhale 2 puffs into the lungs every 4 (four) hours as needed for wheezing or shortness of breath. 1 each 3   ARIPiprazole (ABILIFY) 2 MG tablet Take 2 tablets by mouth daily.     atorvastatin (LIPITOR) 20 MG tablet Take 20 mg by mouth daily.     budesonide (PULMICORT) 0.5 MG/2ML nebulizer solution Take 2 mLs (0.5 mg total) by nebulization daily. 2 mL 1   cetirizine (ZYRTEC) 10 MG tablet Take 1 tablet by mouth daily.     clonazePAM (KLONOPIN) 0.5 MG tablet Take by mouth.     cyanocobalamin 1000 MCG tablet Take 1,000 mcg by mouth 2 (two) times daily.     cyclobenzaprine (FLEXERIL) 10 MG tablet Take 1 tablet by mouth 2 (two) times daily as needed.     Cysteamine Bitartrate (PROCYSBI) 300 MG PACK Use 1 each 3 (three) times daily Use as instructed.     diclofenac Sodium (VOLTAREN) 1 % GEL Apply topically 2 (two) times daily.     diltiazem (TIAZAC) 240 MG 24 hr capsule Take by mouth.     DULoxetine (CYMBALTA) 60 MG capsule Take 60 mg by mouth daily.     esomeprazole (NEXIUM) 40 MG capsule Take by mouth.      ferrous sulfate 325 (65 FE) MG EC tablet Take 1 tablet (325 mg total) by mouth 2 (two) times daily. 60 tablet 3   furosemide (LASIX) 20 MG tablet Take 1 tablet (20 mg total) by mouth daily. 30 tablet 0   gabapentin (NEURONTIN) 400 MG capsule 1 capsule Orally twice a day     glucose blood test strip in the morning, at noon, and at bedtime.     guaiFENesin-codeine 100-10 MG/5ML syrup Take 5 mLs by mouth every 6 (six) hours as needed.     Ipratropium-Albuterol (COMBIVENT) 20-100 MCG/ACT AERS respimat Inhale 1 puff into the lungs every 6 (six) hours. 4 g 2   levofloxacin (LEVAQUIN) 750 MG tablet Take 1 tablet (750 mg total) by mouth daily. 7 tablet 0   lidocaine (LIDODERM) 5 % SMARTSIG:1 Patch(s) Topical     losartan (COZAAR) 25 MG tablet Take 1 tablet (25 mg total) by mouth daily. 30 tablet 5   metFORMIN (GLUCOPHAGE) 1000 MG tablet Take 1,000 mg by mouth 2 (two) times daily.     mometasone (NASONEX) 50 MCG/ACT nasal spray Place 2 sprays into the nose daily.     montelukast (SINGULAIR) 10 MG  tablet Take 1 tablet by mouth daily.     Multiple Vitamin (MULTIVITAMIN) tablet Take 1 tablet by mouth daily.     nicotine (NICODERM CQ - DOSED IN MG/24 HOURS) 21 mg/24hr patch Place onto the skin.     nitroGLYCERIN (NITRODUR - DOSED IN MG/24 HR) 0.2 mg/hr patch PLACE 1 PATCH ONTO THE SKIN ONCE DAILY. LEAVE PATCHES ON FOR 12 TO 14 HOURS THEN REMOVE FOR 10 TO 12 HOURS. PRIOR TO APPLYING THE NEXT PATCH.     olmesartan (BENICAR) 40 MG tablet Take 40 mg by mouth daily.     ondansetron (ZOFRAN-ODT) 4 MG disintegrating tablet Take 1 tablet (4 mg total) by mouth every 8 (eight) hours as needed for nausea or vomiting. 20 tablet 0   pantoprazole (PROTONIX) 40 MG tablet Take by mouth.     predniSONE (DELTASONE) 10 MG tablet Take 1 tablet (10 mg total) by mouth daily. Day 1-3: take 4 tablets PO daily Day 4-6: take 3 tablets PO daily Day 7-9: take 2 tablets PO daily Day 10-12: take 1 tablet PO daily 30 tablet 0    promethazine (PHENERGAN) 25 MG tablet Take 25 mg by mouth every 6 (six) hours as needed.      propranolol (INDERAL) 10 MG tablet Take 10 mg by mouth 2 (two) times daily.     Pseudoeph-Doxylamine-DM-APAP (NYQUIL PO) Take by mouth.     rizatriptan (MAXALT) 10 MG tablet Take by mouth.     sucralfate (CARAFATE) 1 g tablet Take by mouth.     traMADol (ULTRAM) 50 MG tablet Take 50 mg by mouth 2 (two) times daily as needed.     WIXELA INHUB 250-50 MCG/ACT AEPB Inhale 1 puff into the lungs 2 (two) times daily.     No facility-administered medications prior to visit.     Objective:     There were no vitals taken for this visit.         Assessment   No problem-specific Assessment & Plan notes found for this encounter.     Christinia Gully, MD 10/22/2022

## 2022-10-24 ENCOUNTER — Encounter: Payer: Self-pay | Admitting: Oncology

## 2022-12-17 ENCOUNTER — Ambulatory Visit: Payer: Self-pay

## 2022-12-17 ENCOUNTER — Ambulatory Visit
Admission: EM | Admit: 2022-12-17 | Discharge: 2022-12-17 | Disposition: A | Discharge status: Home / Self Care | Payer: Medicaid Other | Attending: Family Medicine | Admitting: Family Medicine

## 2022-12-17 DIAGNOSIS — R202 Paresthesia of skin: Secondary | ICD-10-CM

## 2022-12-17 DIAGNOSIS — E1142 Type 2 diabetes mellitus with diabetic polyneuropathy: Secondary | ICD-10-CM | POA: Diagnosis not present

## 2022-12-17 LAB — POCT FASTING CBG KUC MANUAL ENTRY: POCT Glucose (KUC): 87 mg/dL (ref 70–99)

## 2022-12-17 NOTE — ED Triage Notes (Signed)
Pt reports bilateral pain and burning sensation in toes since last night.

## 2022-12-17 NOTE — Discharge Instructions (Addendum)
You have had labs (blood tests) sent today. We will call you with any significant abnormalities or if there is need to begin or change treatment or pursue further follow up.  You may also review your test results online through MyChart. If you do not have a MyChart account, instructions to sign up should be on your discharge paperwork.  

## 2022-12-18 LAB — COMPREHENSIVE METABOLIC PANEL
ALT: 12 IU/L (ref 0–32)
AST: 18 IU/L (ref 0–40)
Albumin/Globulin Ratio: 2 (ref 1.2–2.2)
Albumin: 4.3 g/dL (ref 3.8–4.9)
Alkaline Phosphatase: 40 IU/L — ABNORMAL LOW (ref 44–121)
BUN/Creatinine Ratio: 11 (ref 9–23)
BUN: 10 mg/dL (ref 6–24)
Bilirubin Total: <0.2 mg/dL (ref 0.0–1.2)
CO2: 21 mmol/L (ref 20–29)
Calcium: 9.4 mg/dL (ref 8.7–10.2)
Chloride: 109 mmol/L — ABNORMAL HIGH (ref 96–106)
Creatinine, Ser: 0.93 mg/dL (ref 0.57–1.00)
Globulin, Total: 2.2 g/dL (ref 1.5–4.5)
Glucose: 82 mg/dL (ref 70–99)
Potassium: 4.1 mmol/L (ref 3.5–5.2)
Sodium: 145 mmol/L — ABNORMAL HIGH (ref 134–144)
Total Protein: 6.5 g/dL (ref 6.0–8.5)
eGFR: 74 mL/min/{1.73_m2} (ref >59)

## 2022-12-18 LAB — VITAMIN B12: Vitamin B-12: 1412 pg/mL — ABNORMAL HIGH (ref 232–1245)

## 2022-12-18 LAB — CBC
Hematocrit: 41.4 % (ref 34.0–46.6)
Hemoglobin: 13.8 g/dL (ref 11.1–15.9)
MCH: 32.5 pg (ref 26.6–33.0)
MCHC: 33.3 g/dL (ref 31.5–35.7)
MCV: 98 fL — ABNORMAL HIGH (ref 79–97)
Platelets: 128 10*3/uL — ABNORMAL LOW (ref 150–450)
RBC: 4.24 x10E6/uL (ref 3.77–5.28)
RDW: 12.2 % (ref 11.7–15.4)
WBC: 5.9 10*3/uL (ref 3.4–10.8)

## 2022-12-18 LAB — TSH: TSH: 0.492 u[IU]/mL (ref 0.450–4.500)

## 2022-12-19 NOTE — ED Provider Notes (Addendum)
[facility]   [id number] 12/17/22 Arrival Time: [id number]  ASSESSMENT & PLAN:  1. Type 2 diabetes mellitus with peripheral neuropathy (HCC)   2. Paresthesias    No lab results that are definitive to explain paresthesias; likely from progression of long-standing DM.  Labs Reviewed  COMPREHENSIVE METABOLIC PANEL - Abnormal; Notable for the following components:      Result Value   Sodium 145 (*)    Chloride 109 (*)    Alkaline Phosphatase 40 (*)    All other components within normal limits   Narrative:    Performed at:  [address] [place], [place]  [id number] Lab Director: Rush Farmer MD, Phone:  [phone]  CBC - Abnormal; Notable for the following components:   MCV 98 (*)    Platelets 128 (*)    All other components within normal limits   Narrative:    Performed at:  [address] [place], [place]  [id number] Lab Director: Rush Farmer MD, Phone:  [phone]  VITAMIN B12 - Abnormal; Notable for the following components:   Vitamin B-12 1,412 (*)    All other components within normal limits   Narrative:    Performed at:  [address] [place], [place]  [id number] Lab Director: Rush Farmer MD, Phone:  [phone]  TSH   Narrative:    Performed at:  [facility] [address] [place], [place]  [id number] Lab Director: Rush Farmer MD, Phone:  [phone]  POCT FASTING CBG Amanda:  Follow-up Information     Schedule an appointment as soon as possible for a visit  with [place].   Contact information: [place] [address] [phone]                Is taking her Rx medications as directed.  Reviewed expectations re: course of current medical issues. Questions answered. Outlined signs and symptoms indicating need for more acute intervention. Patient verbalized understanding. After Visit Summary  given.  SUBJECTIVE: History from: patient. Lacey May is a 52 y.o. female who reports bilateral foot pain described as a burning sensation; noted a day or two ago; worse last evening. Otherwise well. Denies fever or recent illnesses. Is a diabetic. He questions if this could be related. Occasional and mild similar symptoms in the past but very transient.  Past Surgical History:  Procedure Laterality Date   ABDOMINAL HYSTERECTOMY     CESAREAN SECTION     ESOPHAGOGASTRODUODENOSCOPY (EGD) WITH PROPOFOL N/A 03/20/2019   Procedure: ESOPHAGOGASTRODUODENOSCOPY (EGD) WITH PROPOFOL;  Surgeon: Jonathon Bellows, MD;  Location: [facility] ENDOSCOPY;  Service: Gastroenterology;  Laterality: N/A;   NISSEN FUNDOPLICATION        OBJECTIVE:  Vitals:   12/17/22 1217  BP: 133/75  Pulse: 74  Resp: 18  Temp: 99 F (37.2 C)  TempSrc: Oral  SpO2: 96%    General appearance: alert; no distress HEENT: [place]; AT Neck: supple with FROM Resp: unlabored respirations Extremities: Bilateral LE with normal appearing skin; no wounds; reports mildly decreased sensation to light touch; normal cap refill in all toes Skin: warm and dry; no visible rashes Neurologic: gait normal; normal strength of bilateral LE Psychological: alert and cooperative; normal mood and affect   Allergies  Allergen Reactions   Penicillins Anaphylaxis    Other reaction(s): SHORTNESS OF BREATH   Sertraline Palpitations   Clonidine Other (See Comments)  confusion   Nefazodone Other (See Comments)    Tachacardia Other reaction(s): OTHER   Nsaids     Other reaction(s): Unknown Due to internal bleeding   Benzoyl Peroxide Other (See Comments) and Rash   Trazodone Palpitations    HR 160's-180's    Past Medical History:  Diagnosis Date   Asthma    COPD (chronic obstructive pulmonary disease) (HCC)    Diabetes mellitus without complication (HCC)    Fibromyalgia    Hypertension    MS (multiple sclerosis) ([place])    Restrictive  airway disease    Thyroid disease    Social History   Socioeconomic History   Marital status: Married    Spouse name: Not on file   Number of children: Not on file   Years of education: Not on file   Highest education level: Not on file  Occupational History   Not on file  Tobacco Use   Smoking status: Every Day    Packs/day: 0.50    Types: Cigarettes   Smokeless tobacco: Never  Vaping Use   Vaping Use: Never used  Substance and Sexual Activity   Alcohol use: No    Alcohol/week: 0.0 standard drinks of alcohol   Drug use: No   Sexual activity: Not Currently  Other Topics Concern   Not on file  Social History Narrative   Not on file   Social Determinants of Health   Financial Resource Strain: Not on file  Food Insecurity: Not on file  Transportation Needs: Not on file  Physical Activity: Not on file  Stress: Not on file  Social Connections: Not on file   Family History  Adopted: Yes  Problem Relation Age of Onset   Breast cancer Mother    Heart failure Father    Hypertension Father    Lupus Brother    Diabetes Brother    Breast cancer Maternal Aunt    Diabetes Maternal Grandmother    Diabetes Maternal Grandfather    Lung cancer Paternal Grandfather    Past Surgical History:  Procedure Laterality Date   ABDOMINAL HYSTERECTOMY     CESAREAN SECTION     ESOPHAGOGASTRODUODENOSCOPY (EGD) WITH PROPOFOL N/A 03/20/2019   Procedure: ESOPHAGOGASTRODUODENOSCOPY (EGD) WITH PROPOFOL;  Surgeon: Jonathon Bellows, MD;  Location: [facility] ENDOSCOPY;  Service: Gastroenterology;  Laterality: N/A;   NISSEN FUNDOPLICATION         Vanessa Kick, MD 12/19/22 1338    Vanessa Kick, MD 12/19/22 1339

## 2022-12-26 ENCOUNTER — Inpatient Hospital Stay: Admission: RE | Admit: 2022-12-26 | Payer: Medicaid Other | Source: Ambulatory Visit

## 2022-12-27 ENCOUNTER — Ambulatory Visit: Payer: Self-pay

## 2022-12-31 ENCOUNTER — Ambulatory Visit
Admission: RE | Admit: 2022-12-31 | Discharge: 2022-12-31 | Disposition: A | Discharge status: Home / Self Care | Payer: Medicaid Other | Source: Ambulatory Visit | Attending: Nurse Practitioner | Admitting: Nurse Practitioner

## 2022-12-31 VITALS — BP 112/81 | HR 75 | Temp 98.8°F | Resp 16

## 2022-12-31 DIAGNOSIS — J069 Acute upper respiratory infection, unspecified: Secondary | ICD-10-CM

## 2022-12-31 DIAGNOSIS — Z1152 Encounter for screening for COVID-19: Secondary | ICD-10-CM | POA: Insufficient documentation

## 2022-12-31 LAB — POCT INFLUENZA A/B
Influenza A, POC: NEGATIVE
Influenza B, POC: NEGATIVE

## 2022-12-31 NOTE — ED Triage Notes (Signed)
Pt reports body aches,sneezing, coughing, chills, sweats, headaches , loss of appetite, fever, upset stomach, fatigue x 3 days.  Used inhaler, cold and flu dayquil, tylenol, xzyal and nasal but no relief.

## 2022-12-31 NOTE — Discharge Instructions (Addendum)
You have a viral upper respiratory infection.  Symptoms should improve over the next week to 10 days.  If you develop chest pain or shortness of breath, go to the emergency room.  We have tested you today for COVID-19 and influenza.  Influenza test was negative.  You will see the results in Mychart and we will call you with positive results.  Please stay home and isolate until you are aware of the results.    Some things that can make you feel better are: - Increased rest - Increasing fluid with water/sugar free electrolytes - Acetaminophen as needed for fever/pain - Salt water gargling, chloraseptic spray and throat lozenges - OTC guaifenesin (Mucinex) 600 mg twice daily - Saline sinus flushes or a neti pot - Humidifying the air
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
## 2023-01-01 LAB — SARS CORONAVIRUS 2 (TAT 6-24 HRS): SARS Coronavirus 2: NEGATIVE

## 2023-01-15 DIAGNOSIS — J069 Acute upper respiratory infection, unspecified: Secondary | ICD-10-CM | POA: Diagnosis not present

## 2023-01-17 DIAGNOSIS — G894 Chronic pain syndrome: Secondary | ICD-10-CM | POA: Diagnosis not present

## 2023-01-17 DIAGNOSIS — J439 Emphysema, unspecified: Secondary | ICD-10-CM | POA: Diagnosis not present

## 2023-01-17 DIAGNOSIS — G63 Polyneuropathy in diseases classified elsewhere: Secondary | ICD-10-CM | POA: Diagnosis not present

## 2023-01-24 DIAGNOSIS — Z124 Encounter for screening for malignant neoplasm of cervix: Secondary | ICD-10-CM | POA: Diagnosis not present

## 2023-01-24 DIAGNOSIS — Z1231 Encounter for screening mammogram for malignant neoplasm of breast: Secondary | ICD-10-CM | POA: Diagnosis not present

## 2023-01-24 DIAGNOSIS — Z0000 Encounter for general adult medical examination without abnormal findings: Secondary | ICD-10-CM | POA: Diagnosis not present

## 2023-01-24 DIAGNOSIS — E1165 Type 2 diabetes mellitus with hyperglycemia: Secondary | ICD-10-CM | POA: Diagnosis not present

## 2023-01-24 DIAGNOSIS — Z8742 Personal history of other diseases of the female genital tract: Secondary | ICD-10-CM | POA: Diagnosis not present

## 2023-01-24 DIAGNOSIS — R5383 Other fatigue: Secondary | ICD-10-CM | POA: Diagnosis not present

## 2023-01-24 DIAGNOSIS — R87810 Cervical high risk human papillomavirus (HPV) DNA test positive: Secondary | ICD-10-CM | POA: Diagnosis not present

## 2023-01-24 DIAGNOSIS — Z8719 Personal history of other diseases of the digestive system: Secondary | ICD-10-CM | POA: Diagnosis not present

## 2023-01-24 DIAGNOSIS — Z794 Long term (current) use of insulin: Secondary | ICD-10-CM | POA: Diagnosis not present

## 2023-01-24 DIAGNOSIS — F17200 Nicotine dependence, unspecified, uncomplicated: Secondary | ICD-10-CM | POA: Diagnosis not present

## 2023-02-08 DIAGNOSIS — F17210 Nicotine dependence, cigarettes, uncomplicated: Secondary | ICD-10-CM | POA: Diagnosis not present

## 2023-02-09 DIAGNOSIS — F17210 Nicotine dependence, cigarettes, uncomplicated: Secondary | ICD-10-CM | POA: Diagnosis not present

## 2023-02-22 DIAGNOSIS — J441 Chronic obstructive pulmonary disease with (acute) exacerbation: Secondary | ICD-10-CM | POA: Diagnosis not present

## 2023-02-22 DIAGNOSIS — G894 Chronic pain syndrome: Secondary | ICD-10-CM | POA: Diagnosis not present

## 2023-02-22 DIAGNOSIS — M5416 Radiculopathy, lumbar region: Secondary | ICD-10-CM | POA: Diagnosis not present

## 2023-04-08 DIAGNOSIS — M5416 Radiculopathy, lumbar region: Secondary | ICD-10-CM | POA: Diagnosis not present

## 2023-04-08 DIAGNOSIS — L811 Chloasma: Secondary | ICD-10-CM | POA: Diagnosis not present

## 2023-04-17 DIAGNOSIS — M25532 Pain in left wrist: Secondary | ICD-10-CM | POA: Diagnosis not present

## 2023-04-17 DIAGNOSIS — M25531 Pain in right wrist: Secondary | ICD-10-CM | POA: Diagnosis not present

## 2023-06-13 ENCOUNTER — Other Ambulatory Visit: Payer: Self-pay

## 2023-06-13 ENCOUNTER — Emergency Department (HOSPITAL_COMMUNITY): Payer: Medicaid Other

## 2023-06-13 ENCOUNTER — Encounter (HOSPITAL_COMMUNITY): Payer: Self-pay | Admitting: Emergency Medicine

## 2023-06-13 ENCOUNTER — Emergency Department (HOSPITAL_COMMUNITY)
Admission: EM | Admit: 2023-06-13 | Discharge: 2023-06-13 | Discharge status: Against Medical Advice | Payer: Medicaid Other | Attending: Emergency Medicine | Admitting: Emergency Medicine

## 2023-06-13 DIAGNOSIS — Z79899 Other long term (current) drug therapy: Secondary | ICD-10-CM | POA: Insufficient documentation

## 2023-06-13 DIAGNOSIS — R202 Paresthesia of skin: Secondary | ICD-10-CM | POA: Diagnosis not present

## 2023-06-13 DIAGNOSIS — I10 Essential (primary) hypertension: Secondary | ICD-10-CM | POA: Insufficient documentation

## 2023-06-13 DIAGNOSIS — Z5329 Procedure and treatment not carried out because of patient's decision for other reasons: Secondary | ICD-10-CM | POA: Diagnosis not present

## 2023-06-13 DIAGNOSIS — R200 Anesthesia of skin: Secondary | ICD-10-CM | POA: Insufficient documentation

## 2023-06-13 DIAGNOSIS — R519 Headache, unspecified: Secondary | ICD-10-CM | POA: Insufficient documentation

## 2023-06-13 DIAGNOSIS — J449 Chronic obstructive pulmonary disease, unspecified: Secondary | ICD-10-CM | POA: Diagnosis not present

## 2023-06-13 LAB — CBC WITH DIFFERENTIAL/PLATELET
Abs Immature Granulocytes: 0.01 10*3/uL (ref 0.00–0.07)
Basophils Absolute: 0 10*3/uL (ref 0.0–0.1)
Basophils Relative: 1 %
Eosinophils Absolute: 0.1 10*3/uL (ref 0.0–0.5)
Eosinophils Relative: 2 %
HCT: 44.1 % (ref 36.0–46.0)
Hemoglobin: 14.4 g/dL (ref 12.0–15.0)
Immature Granulocytes: 0 %
Lymphocytes Relative: 36 %
Lymphs Abs: 2.1 10*3/uL (ref 0.7–4.0)
MCH: 32.6 pg (ref 26.0–34.0)
MCHC: 32.7 g/dL (ref 30.0–36.0)
MCV: 99.8 fL (ref 80.0–100.0)
Monocytes Absolute: 0.7 10*3/uL (ref 0.1–1.0)
Monocytes Relative: 12 %
Neutro Abs: 2.9 10*3/uL (ref 1.7–7.7)
Neutrophils Relative %: 49 %
Platelets: 143 10*3/uL — ABNORMAL LOW (ref 150–400)
RBC: 4.42 MIL/uL (ref 3.87–5.11)
RDW: 13.9 % (ref 11.5–15.5)
WBC: 5.9 10*3/uL (ref 4.0–10.5)
nRBC: 0 % (ref 0.0–0.2)

## 2023-06-13 LAB — BASIC METABOLIC PANEL
Anion gap: 12 (ref 5–15)
BUN: 14 mg/dL (ref 6–20)
CO2: 24 mmol/L (ref 22–32)
Calcium: 8.7 mg/dL — ABNORMAL LOW (ref 8.9–10.3)
Chloride: 104 mmol/L (ref 98–111)
Creatinine, Ser: 1.05 mg/dL — ABNORMAL HIGH (ref 0.44–1.00)
GFR, Estimated: >60 mL/min (ref >60)
Glucose, Bld: 77 mg/dL (ref 70–99)
Potassium: 3.9 mmol/L (ref 3.5–5.1)
Sodium: 140 mmol/L (ref 135–145)

## 2023-06-13 NOTE — Discharge Instructions (Signed)
As we discussed, since we did not get the CT scan it is unclear of exactly what is going on.  I would highly suggest that you reach out to your primary care doctor to get a CT scan done in the outpatient.  You can return to the emerged apartment anytime sooner than that if your symptoms are persistent or getting worse or if you have any other concerns.

## 2023-06-13 NOTE — ED Provider Notes (Signed)
[place] EMERGENCY DEPARTMENT AT [facility] Provider Note   CSN: [id number] Arrival date & time: 06/13/23  1404     History Chief Complaint  Patient presents with   Headache    Lacey May is a 52 y.o. female with history of COPD, hypertension, multiple sclerosis who presents to the emergency department today with headache and facial pressure that started yesterday.  Patient states that she does suffer from headaches but had a very severe headache yesterday.  She states that this morning she woke up and had some pressure on the right side of her head and face.  It felt like she was wearing a shower.  This has been persistent throughout the day.  Headache has resolved today.  She denies any nausea, vomiting, fever, chills, abdominal pain.  Patient does endorse trouble walking but this is a chronic issue since she has had multiple sclerosis.  She does endorse associated trouble speaking as she states is difficult to get the words out sometimes.  She denies any visual disturbances, focal weakness or numbness to her extremities.   Headache      Home Medications Prior to Admission medications   Medication Sig Start Date End Date Taking? Authorizing Provider  Accu-Chek Softclix Lancets lancets 3 (three) times daily. 11/02/21   [provider]  Acetaminophen 325 MG CAPS Take by mouth.    [provider]  albuterol (VENTOLIN HFA) 108 (90 Base) MCG/ACT inhaler Inhale 2 puffs into the lungs every 4 (four) hours as needed for wheezing or shortness of breath. 10/26/21   Eber Hong, MD  ARIPiprazole (ABILIFY) 2 MG tablet Take 2 tablets by mouth daily. 12/25/21   [provider]  atorvastatin (LIPITOR) 20 MG tablet Take 20 mg by mouth daily. 11/16/21   [provider]  budesonide (PULMICORT) 0.5 MG/2ML nebulizer solution Take 2 mLs (0.5 mg total) by nebulization daily. 02/17/22   Particia Nearing, PA-C  clonazePAM (KLONOPIN) 0.5 MG tablet Take by  mouth. 09/02/13   [provider]  cyanocobalamin 1000 MCG tablet Take 1,000 mcg by mouth 2 (two) times daily.    [provider]  cyclobenzaprine (FLEXERIL) 10 MG tablet Take 1 tablet by mouth 2 (two) times daily as needed. 05/04/21   [provider]  diclofenac Sodium (VOLTAREN) 1 % GEL Apply topically 2 (two) times daily. 06/28/20   [provider]  diltiazem (TIAZAC) 240 MG 24 hr capsule Take by mouth. 05/04/21 05/04/22  [provider]  DULoxetine (CYMBALTA) 60 MG capsule Take 60 mg by mouth daily. 10/18/21   [provider]  esomeprazole (NEXIUM) 40 MG capsule Take by mouth. 03/03/13   [provider]  ferrous sulfate 325 (65 FE) MG EC tablet Take 1 tablet (325 mg total) by mouth 2 (two) times daily. 03/21/19 03/20/20  Ihor Austin, MD  furosemide (LASIX) 20 MG tablet Take 1 tablet (20 mg total) by mouth daily. 01/01/22   Wallis Bamberg, PA-C  gabapentin (NEURONTIN) 400 MG capsule 1 capsule Orally twice a day 11/16/21   [provider]  glucose blood test strip in the morning, at noon, and at bedtime. 11/02/21   [provider]  guaiFENesin-codeine 100-10 MG/5ML syrup Take 5 mLs by mouth every 6 (six) hours as needed. 11/02/21   [provider]  Ipratropium-Albuterol (COMBIVENT) 20-100 MCG/ACT AERS respimat Inhale 1 puff into the lungs every 6 (six) hours. 02/17/22   Particia Nearing, PA-C  levofloxacin (LEVAQUIN) 750 MG tablet Take 1  tablet (750 mg total) by mouth daily. 01/01/22   Wallis Bamberg, PA-C  lidocaine (LIDODERM) 5 % SMARTSIG:1 Patch(s) Topical 01/11/22   [provider]  losartan (COZAAR) 25 MG tablet Take 1 tablet (25 mg total) by mouth daily. 10/09/22 10/09/23  Minna Antis, MD  metFORMIN (GLUCOPHAGE) 1000 MG tablet Take 1,000 mg by mouth 2 (two) times daily. 11/16/21   [provider]  mometasone (NASONEX) 50 MCG/ACT nasal spray Place 2 sprays into the nose daily.    [provider]  Multiple Vitamin (MULTIVITAMIN) tablet Take 1 tablet by mouth daily.    [provider]  nicotine (NICODERM CQ - DOSED IN MG/24 HOURS) 21 mg/24hr patch Place onto the skin. 06/28/20   [provider]  nitroGLYCERIN (NITRODUR - DOSED IN MG/24 HR) 0.2 mg/hr patch PLACE 1 PATCH ONTO THE SKIN ONCE DAILY. LEAVE PATCHES ON FOR 12 TO 14 HOURS THEN REMOVE FOR 10 TO 12 HOURS. PRIOR TO APPLYING THE NEXT PATCH. 12/18/21   [provider]  olmesartan (BENICAR) 40 MG tablet Take 40 mg by mouth daily. 01/29/22   [provider]  ondansetron (ZOFRAN-ODT) 4 MG disintegrating tablet Take 1 tablet (4 mg total) by mouth every 8 (eight) hours as needed for nausea or vomiting. 02/12/22   Leath-Warren, Sadie Haber, NP  pantoprazole (PROTONIX) 40 MG tablet Take by mouth. 05/04/21   [provider]  promethazine (PHENERGAN) 25 MG tablet Take 25 mg by mouth every 6 (six) hours as needed.  12/11/11   [provider]  propranolol (INDERAL) 10 MG tablet Take 10 mg by mouth 2 (two) times daily. 12/30/21   [provider]  Pseudoeph-Doxylamine-DM-APAP (NYQUIL PO) Take by mouth.    [provider]  rizatriptan (MAXALT) 10 MG tablet Take by mouth. 01/10/22   [provider]  sucralfate (CARAFATE) 1 g tablet Take by mouth. 03/31/19 03/30/20  [provider]  traMADol (ULTRAM) 50 MG tablet Take 50 mg by mouth 2 (two) times daily as needed. 05/27/21   [provider]  Monte Fantasia INHUB 250-50 MCG/ACT AEPB Inhale 1 puff into the lungs 2 (two) times daily. 02/07/22   [provider]      Allergies    Penicillins, Sertraline, Clonidine, Nefazodone, Nsaids, Benzoyl peroxide, and Trazodone    Review of Systems   Review of Systems  Neurological:  Positive for headaches.  All other systems reviewed and are negative.   Physical Exam Updated Vital Signs BP 116/74 (BP Location: Right Arm)   Pulse 73   Temp 98.9 F (37.2 C) (Oral)    Resp 18   SpO2 93%  Physical Exam Vitals and nursing note reviewed.  Constitutional:      General: She is not in acute distress.    Appearance: Normal appearance.  HENT:     Head: Normocephalic and atraumatic.  Eyes:     General:        Right eye: No discharge.        Left eye: No discharge.  Cardiovascular:     Comments: Regular rate and rhythm.  S1/S2 are distinct without any evidence of murmur, rubs, or gallops.  Radial pulses are 2+ bilaterally.  Dorsalis pedis pulses are 2+ bilaterally.  No evidence of pedal edema. Pulmonary:     Comments: Clear to auscultation bilaterally.  Normal effort.  No respiratory distress.  No evidence of wheezes, rales, or rhonchi heard throughout. Abdominal:     General: Abdomen is flat. Bowel sounds are normal. There  is no distension.     Tenderness: There is no abdominal tenderness. There is no guarding or rebound.  Musculoskeletal:        General: Normal range of motion.     Cervical back: Neck supple.  Skin:    General: Skin is warm and dry.     Findings: No rash.  Neurological:     General: No focal deficit present.     Mental Status: She is alert.     Comments: Cranial nerves 2-12 are intact.  5/5 strength to the upper and lower extremities.  Normal sensation to the upper and lower extremities.  No dysmetria with finger-nose.  Extraocular movements are intact.  Speech is normal without any garbled speech or slurred speech.  No facial droop.  Psychiatric:        Mood and Affect: Mood normal.        Behavior: Behavior normal.     ED Results / Procedures / Treatments   Labs (all labs ordered are listed, but only abnormal results are displayed) Labs Reviewed  CBC WITH DIFFERENTIAL/PLATELET - Abnormal; Notable for the following components:      Result Value   Platelets 143 (*)    All other components within normal limits  BASIC METABOLIC PANEL - Abnormal; Notable for the following components:   Creatinine, Ser 1.05 (*)    Calcium 8.7  (*)    All other components within normal limits    EKG None  Radiology No results found.  Procedures Procedures    Medications Ordered in ED Medications - No data to display  ED Course/ Medical Decision Making/ A&P Clinical Course as of 06/13/23 1844  Thu Jun 13, 2023  1654 CBC with Differential(!) Negative.  [CF]  1741 Basic metabolic panel(!) Normal.  [CF]  1834 Patient states that she has to go home and take care of her son and she does not have any else to watch him.  We went over the risks of not obtaining the CT scan as this could be a stroke.  We went over the risks extensively at the bedside including permanent brain damage, serious changes in her activities of daily living, and ultimately death.  Patient understood all of the risks of leaving the hospital AGAINST MEDICAL ADVICE and still chooses to leave.  She was encouraged to return to the emergency department for any worsening symptoms when she gets her child care in order. All questions and concerns addressed.  [CF]    Clinical Course User Index [CF] Teressa Lower, PA-C   {   Click here for ABCD2, HEART and other calculators  Medical Decision Making JEANINE BRANSOM is a 52 y.o. female patient who presents to the emerged apartment today for further evaluation of a headache and facial numbness.  Given the patient's risk factors and past medical history I will likely get basic labs and a CT scan.  Patient is outside of acute stroke window.  No signs of LVO here today.  As highlighted in the ED course, patient needs to go home due to childcare issues.  CT scan has not been done.  She will be signing out AMA.  We discussed all the risks of leaving AMA which has highlighted in the ED course.   Amount and/or Complexity of Data Reviewed Labs: ordered. Decision-making details documented in ED Course. Radiology: ordered.    Final Clinical Impression(s) / ED Diagnoses Final diagnoses:  Acute nonintractable  headache, unspecified headache type  Facial numbness  Rx / DC Orders ED Discharge Orders     None         Teressa Lower, [place] 06/13/23 1844    Glyn Ade, MD 06/14/23 Jacinta Shoe

## 2023-06-13 NOTE — ED Triage Notes (Signed)
Pt reports headache and right sided facial numbness/burning that started a 0530 this morning. Pt denies any weakness, pt ambulatory to triage room.

## 2023-06-17 DIAGNOSIS — Z794 Long term (current) use of insulin: Secondary | ICD-10-CM | POA: Diagnosis not present

## 2023-06-17 DIAGNOSIS — E1165 Type 2 diabetes mellitus with hyperglycemia: Secondary | ICD-10-CM | POA: Diagnosis not present

## 2023-06-17 DIAGNOSIS — E7800 Pure hypercholesterolemia, unspecified: Secondary | ICD-10-CM | POA: Diagnosis not present

## 2023-06-17 DIAGNOSIS — G894 Chronic pain syndrome: Secondary | ICD-10-CM | POA: Diagnosis not present

## 2023-06-17 DIAGNOSIS — F17210 Nicotine dependence, cigarettes, uncomplicated: Secondary | ICD-10-CM | POA: Diagnosis not present

## 2023-06-17 DIAGNOSIS — Z23 Encounter for immunization: Secondary | ICD-10-CM | POA: Diagnosis not present

## 2023-06-17 DIAGNOSIS — I10 Essential (primary) hypertension: Secondary | ICD-10-CM | POA: Diagnosis not present

## 2023-06-17 DIAGNOSIS — Z139 Encounter for screening, unspecified: Secondary | ICD-10-CM | POA: Diagnosis not present

## 2023-06-17 DIAGNOSIS — M5416 Radiculopathy, lumbar region: Secondary | ICD-10-CM | POA: Diagnosis not present

## 2023-06-17 DIAGNOSIS — F332 Major depressive disorder, recurrent severe without psychotic features: Secondary | ICD-10-CM | POA: Diagnosis not present

## 2023-07-04 DIAGNOSIS — J441 Chronic obstructive pulmonary disease with (acute) exacerbation: Secondary | ICD-10-CM | POA: Diagnosis not present

## 2023-07-04 DIAGNOSIS — R634 Abnormal weight loss: Secondary | ICD-10-CM | POA: Diagnosis not present

## 2023-07-04 DIAGNOSIS — R051 Acute cough: Secondary | ICD-10-CM | POA: Diagnosis not present

## 2023-08-29 ENCOUNTER — Ambulatory Visit: Payer: Self-pay

## 2023-09-13 ENCOUNTER — Ambulatory Visit: Payer: Self-pay

## 2023-09-18 DIAGNOSIS — R051 Acute cough: Secondary | ICD-10-CM | POA: Diagnosis not present

## 2023-09-18 DIAGNOSIS — J441 Chronic obstructive pulmonary disease with (acute) exacerbation: Secondary | ICD-10-CM | POA: Diagnosis not present

## 2023-11-18 DIAGNOSIS — R051 Acute cough: Secondary | ICD-10-CM | POA: Diagnosis not present

## 2023-11-21 ENCOUNTER — Ambulatory Visit: Payer: Self-pay

## 2023-12-09 DIAGNOSIS — E785 Hyperlipidemia, unspecified: Secondary | ICD-10-CM | POA: Diagnosis not present

## 2023-12-09 DIAGNOSIS — E1165 Type 2 diabetes mellitus with hyperglycemia: Secondary | ICD-10-CM | POA: Diagnosis not present

## 2023-12-09 DIAGNOSIS — G35 Multiple sclerosis: Secondary | ICD-10-CM | POA: Diagnosis not present

## 2023-12-09 DIAGNOSIS — J441 Chronic obstructive pulmonary disease with (acute) exacerbation: Secondary | ICD-10-CM | POA: Diagnosis not present

## 2023-12-09 DIAGNOSIS — I10 Essential (primary) hypertension: Secondary | ICD-10-CM | POA: Diagnosis not present

## 2023-12-09 DIAGNOSIS — I7090 Unspecified atherosclerosis: Secondary | ICD-10-CM | POA: Diagnosis not present

## 2023-12-09 DIAGNOSIS — E1169 Type 2 diabetes mellitus with other specified complication: Secondary | ICD-10-CM | POA: Diagnosis not present

## 2023-12-17 DIAGNOSIS — R040 Epistaxis: Secondary | ICD-10-CM | POA: Diagnosis not present

## 2023-12-17 DIAGNOSIS — I10 Essential (primary) hypertension: Secondary | ICD-10-CM | POA: Diagnosis not present

## 2023-12-17 DIAGNOSIS — I517 Cardiomegaly: Secondary | ICD-10-CM | POA: Diagnosis not present

## 2023-12-17 DIAGNOSIS — F17210 Nicotine dependence, cigarettes, uncomplicated: Secondary | ICD-10-CM | POA: Diagnosis not present

## 2023-12-17 DIAGNOSIS — Z7984 Long term (current) use of oral hypoglycemic drugs: Secondary | ICD-10-CM | POA: Diagnosis not present

## 2023-12-17 DIAGNOSIS — D696 Thrombocytopenia, unspecified: Secondary | ICD-10-CM | POA: Diagnosis not present

## 2023-12-17 DIAGNOSIS — Z87898 Personal history of other specified conditions: Secondary | ICD-10-CM | POA: Diagnosis not present

## 2023-12-17 DIAGNOSIS — E1165 Type 2 diabetes mellitus with hyperglycemia: Secondary | ICD-10-CM | POA: Diagnosis not present

## 2023-12-17 DIAGNOSIS — J449 Chronic obstructive pulmonary disease, unspecified: Secondary | ICD-10-CM | POA: Diagnosis not present

## 2023-12-17 DIAGNOSIS — Z8719 Personal history of other diseases of the digestive system: Secondary | ICD-10-CM | POA: Diagnosis not present

## 2023-12-17 DIAGNOSIS — Z79899 Other long term (current) drug therapy: Secondary | ICD-10-CM | POA: Insufficient documentation

## 2023-12-19 ENCOUNTER — Encounter: Payer: Self-pay | Admitting: Oncology

## 2023-12-31 ENCOUNTER — Other Ambulatory Visit

## 2023-12-31 ENCOUNTER — Encounter: Admitting: Oncology

## 2024-01-03 ENCOUNTER — Inpatient Hospital Stay: Admitting: Oncology

## 2024-01-03 ENCOUNTER — Inpatient Hospital Stay

## 2024-01-30 NOTE — Progress Notes (Deleted)
 Lacey May, female    DOB: [birth year]    MRN: [id number]   Brief patient profile:  53  yo*** *** referred to pulmonary clinic in [place]  01/31/2024 by *** for ***   Not prev seen by PCCM   History of Present Illness  01/31/2024  Pulmonary/ 1st office eval/ [facility] / [place] [facility]  No chief complaint on file.    Dyspnea:  *** Cough: *** Sleep: *** SABA use: *** 02: *** LDSCT:***  No obvious day to day or daytime pattern/variability or assoc excess/ purulent sputum or mucus plugs or hemoptysis or cp or chest tightness, subjective wheeze or overt sinus or hb symptoms.    Also denies any obvious fluctuation of symptoms with weather or environmental changes or other aggravating or alleviating factors except as outlined above   No unusual exposure hx or h/o childhood pna/ asthma or knowledge of premature birth.  Current Allergies, Complete Past Medical History, Past Surgical History, Family History, and Social History were reviewed in [employer] record.  ROS  The following are not active complaints unless bolded Hoarseness, sore throat, dysphagia, dental problems, itching, sneezing,  nasal congestion or discharge of excess mucus or purulent secretions, ear ache,   fever, chills, sweats, unintended wt loss or wt gain, classically pleuritic or exertional cp,  orthopnea pnd or arm/hand swelling  or leg swelling, presyncope, palpitations, abdominal pain, anorexia, nausea, vomiting, diarrhea  or change in bowel habits or change in bladder habits, change in stools or change in urine, dysuria, hematuria,  rash, arthralgias, visual complaints, headache, numbness, weakness or ataxia or problems with walking or coordination,  change in mood or  memory.            Outpatient Medications Prior to Visit  Medication Sig Dispense Refill   Accu-Chek Softclix Lancets lancets 3 (three) times daily.     Acetaminophen  325 MG CAPS Take by mouth.     albuterol  (VENTOLIN   HFA) 108 (90 Base) MCG/ACT inhaler Inhale 2 puffs into the lungs every 4 (four) hours as needed for wheezing or shortness of breath. 1 each 3   ARIPiprazole (ABILIFY) 2 MG tablet Take 2 tablets by mouth daily.     atorvastatin (LIPITOR) 20 MG tablet Take 20 mg by mouth daily.     budesonide  (PULMICORT ) 0.5 MG/2ML nebulizer solution Take 2 mLs (0.5 mg total) by nebulization daily. 2 mL 1   clonazePAM (KLONOPIN) 0.5 MG tablet Take by mouth.     cyanocobalamin  1000 MCG tablet Take 1,000 mcg by mouth 2 (two) times daily.     cyclobenzaprine  (FLEXERIL ) 10 MG tablet Take 1 tablet by mouth 2 (two) times daily as needed.     diclofenac Sodium (VOLTAREN) 1 % GEL Apply topically 2 (two) times daily.     diltiazem (TIAZAC) 240 MG 24 hr capsule Take by mouth.     DULoxetine (CYMBALTA) 60 MG capsule Take 60 mg by mouth daily.     esomeprazole (NEXIUM) 40 MG capsule Take by mouth.     ferrous sulfate  325 (65 FE) MG EC tablet Take 1 tablet (325 mg total) by mouth 2 (two) times daily. 60 tablet 3   furosemide  (LASIX ) 20 MG tablet Take 1 tablet (20 mg total) by mouth daily. 30 tablet 0   gabapentin  (NEURONTIN ) 400 MG capsule 1 capsule Orally twice a day     glucose blood test strip in the morning, at noon, and at bedtime.     guaiFENesin-codeine 100-10 MG/5ML  syrup Take 5 mLs by mouth every 6 (six) hours as needed.     Ipratropium-Albuterol  (COMBIVENT) 20-100 MCG/ACT AERS respimat Inhale 1 puff into the lungs every 6 (six) hours. 4 g 2   levofloxacin  (LEVAQUIN ) 750 MG tablet Take 1 tablet (750 mg total) by mouth daily. 7 tablet 0   lidocaine  (LIDODERM ) 5 % SMARTSIG:1 Patch(s) Topical     losartan  (COZAAR ) 25 MG tablet Take 1 tablet (25 mg total) by mouth daily. 30 tablet 5   metFORMIN (GLUCOPHAGE) 1000 MG tablet Take 1,000 mg by mouth 2 (two) times daily.     mometasone (NASONEX) 50 MCG/ACT nasal spray Place 2 sprays into the nose daily.     Multiple Vitamin (MULTIVITAMIN) tablet Take 1 tablet by mouth daily.      nicotine  (NICODERM CQ  - DOSED IN MG/24 HOURS) 21 mg/24hr patch Place onto the skin.     nitroGLYCERIN  (NITRODUR - DOSED IN MG/24 HR) 0.2 mg/hr patch PLACE 1 PATCH ONTO THE SKIN ONCE DAILY. LEAVE PATCHES ON FOR 12 TO 14 HOURS THEN REMOVE FOR 10 TO 12 HOURS. PRIOR TO APPLYING THE NEXT PATCH.     olmesartan (BENICAR) 40 MG tablet Take 40 mg by mouth daily.     ondansetron  (ZOFRAN -ODT) 4 MG disintegrating tablet Take 1 tablet (4 mg total) by mouth every 8 (eight) hours as needed for nausea or vomiting. 20 tablet 0   pantoprazole  (PROTONIX ) 40 MG tablet Take by mouth.     promethazine  (PHENERGAN ) 25 MG tablet Take 25 mg by mouth every 6 (six) hours as needed.      propranolol (INDERAL) 10 MG tablet Take 10 mg by mouth 2 (two) times daily.     Pseudoeph-Doxylamine-DM-APAP (NYQUIL PO) Take by mouth.     rizatriptan (MAXALT) 10 MG tablet Take by mouth.     sucralfate (CARAFATE) 1 g tablet Take by mouth.     traMADol (ULTRAM) 50 MG tablet Take 50 mg by mouth 2 (two) times daily as needed.     WIXELA INHUB 250-50 MCG/ACT AEPB Inhale 1 puff into the lungs 2 (two) times daily.     No facility-administered medications prior to visit.    Past Medical History:  Diagnosis Date   Asthma    COPD (chronic obstructive pulmonary disease) (HCC)    Diabetes mellitus without complication (HCC)    Fibromyalgia    Hypertension    MS (multiple sclerosis) (HCC)    Restrictive airway disease    Thyroid disease       Objective:     There were no vitals taken for this visit.         Assessment   No problem-specific Assessment & Plan notes found for this encounter.     Vernestine Gondola, MD 01/30/2024

## 2024-01-31 ENCOUNTER — Encounter: Payer: Self-pay | Admitting: Internal Medicine

## 2024-01-31 ENCOUNTER — Ambulatory Visit: Admitting: Internal Medicine

## 2024-02-10 ENCOUNTER — Ambulatory Visit: Payer: Self-pay

## 2024-02-24 ENCOUNTER — Telehealth: Payer: Self-pay | Admitting: Oncology

## 2024-02-24 NOTE — Telephone Encounter (Signed)
 Pt left a vm and wants to make an appt. It looks like the past 2 new heme appts were canceled/no showed.

## 2024-03-11 ENCOUNTER — Inpatient Hospital Stay: Admitting: Oncology

## 2024-03-11 ENCOUNTER — Inpatient Hospital Stay

## 2024-05-10 ENCOUNTER — Ambulatory Visit: Payer: Self-pay

## 2024-05-20 ENCOUNTER — Ambulatory Visit: Payer: Self-pay

## 2024-06-22 DIAGNOSIS — J069 Acute upper respiratory infection, unspecified: Secondary | ICD-10-CM | POA: Diagnosis not present

## 2024-06-23 ENCOUNTER — Ambulatory Visit: Payer: Self-pay

## 2024-06-24 DIAGNOSIS — F332 Major depressive disorder, recurrent severe without psychotic features: Secondary | ICD-10-CM | POA: Diagnosis not present

## 2024-06-24 DIAGNOSIS — J441 Chronic obstructive pulmonary disease with (acute) exacerbation: Secondary | ICD-10-CM | POA: Diagnosis not present

## 2024-06-24 DIAGNOSIS — R051 Acute cough: Secondary | ICD-10-CM | POA: Diagnosis not present

## 2024-06-24 DIAGNOSIS — G63 Polyneuropathy in diseases classified elsewhere: Secondary | ICD-10-CM | POA: Diagnosis not present

## 2024-06-24 DIAGNOSIS — J029 Acute pharyngitis, unspecified: Secondary | ICD-10-CM | POA: Diagnosis not present

## 2024-06-29 DIAGNOSIS — E1122 Type 2 diabetes mellitus with diabetic chronic kidney disease: Secondary | ICD-10-CM | POA: Diagnosis not present

## 2024-06-29 DIAGNOSIS — E1136 Type 2 diabetes mellitus with diabetic cataract: Secondary | ICD-10-CM | POA: Diagnosis not present

## 2024-06-30 DIAGNOSIS — E785 Hyperlipidemia, unspecified: Secondary | ICD-10-CM | POA: Diagnosis not present

## 2024-06-30 DIAGNOSIS — J449 Chronic obstructive pulmonary disease, unspecified: Secondary | ICD-10-CM | POA: Diagnosis not present

## 2024-06-30 DIAGNOSIS — I10 Essential (primary) hypertension: Secondary | ICD-10-CM | POA: Diagnosis not present

## 2024-06-30 DIAGNOSIS — E1169 Type 2 diabetes mellitus with other specified complication: Secondary | ICD-10-CM | POA: Diagnosis not present

## 2024-06-30 DIAGNOSIS — E1165 Type 2 diabetes mellitus with hyperglycemia: Secondary | ICD-10-CM | POA: Diagnosis not present

## 2024-06-30 DIAGNOSIS — Z139 Encounter for screening, unspecified: Secondary | ICD-10-CM | POA: Diagnosis not present

## 2024-06-30 DIAGNOSIS — R634 Abnormal weight loss: Secondary | ICD-10-CM | POA: Diagnosis not present

## 2024-07-10 DIAGNOSIS — N3946 Mixed incontinence: Secondary | ICD-10-CM | POA: Diagnosis not present

## 2024-08-05 ENCOUNTER — Ambulatory Visit

## 2024-08-09 DIAGNOSIS — N3946 Mixed incontinence: Secondary | ICD-10-CM | POA: Diagnosis not present

## 2024-08-10 DIAGNOSIS — E1122 Type 2 diabetes mellitus with diabetic chronic kidney disease: Secondary | ICD-10-CM | POA: Diagnosis not present

## 2024-08-10 DIAGNOSIS — E1136 Type 2 diabetes mellitus with diabetic cataract: Secondary | ICD-10-CM | POA: Diagnosis not present

## 2024-08-11 DIAGNOSIS — J449 Chronic obstructive pulmonary disease, unspecified: Secondary | ICD-10-CM | POA: Diagnosis not present

## 2024-08-11 DIAGNOSIS — R0789 Other chest pain: Secondary | ICD-10-CM | POA: Diagnosis not present

## 2024-08-11 DIAGNOSIS — E7800 Pure hypercholesterolemia, unspecified: Secondary | ICD-10-CM | POA: Diagnosis not present

## 2024-08-11 DIAGNOSIS — R002 Palpitations: Secondary | ICD-10-CM | POA: Diagnosis not present

## 2024-08-11 DIAGNOSIS — I10 Essential (primary) hypertension: Secondary | ICD-10-CM | POA: Diagnosis not present

## 2024-08-11 DIAGNOSIS — E1142 Type 2 diabetes mellitus with diabetic polyneuropathy: Secondary | ICD-10-CM | POA: Diagnosis not present

## 2024-08-26 DIAGNOSIS — R0683 Snoring: Secondary | ICD-10-CM | POA: Diagnosis not present

## 2024-08-26 DIAGNOSIS — R002 Palpitations: Secondary | ICD-10-CM | POA: Diagnosis not present

## 2024-08-26 DIAGNOSIS — I10 Essential (primary) hypertension: Secondary | ICD-10-CM | POA: Diagnosis not present

## 2024-08-26 DIAGNOSIS — G4719 Other hypersomnia: Secondary | ICD-10-CM | POA: Diagnosis not present

## 2024-08-26 DIAGNOSIS — Z7689 Persons encountering health services in other specified circumstances: Secondary | ICD-10-CM | POA: Diagnosis not present

## 2024-09-01 DIAGNOSIS — G4710 Hypersomnia, unspecified: Secondary | ICD-10-CM | POA: Diagnosis not present

## 2024-09-01 DIAGNOSIS — R7989 Other specified abnormal findings of blood chemistry: Secondary | ICD-10-CM | POA: Diagnosis not present

## 2024-09-01 DIAGNOSIS — E870 Hyperosmolality and hypernatremia: Secondary | ICD-10-CM | POA: Diagnosis not present

## 2024-09-01 DIAGNOSIS — R079 Chest pain, unspecified: Secondary | ICD-10-CM | POA: Diagnosis not present

## 2024-09-01 DIAGNOSIS — R9389 Abnormal findings on diagnostic imaging of other specified body structures: Secondary | ICD-10-CM | POA: Diagnosis not present

## 2024-09-01 DIAGNOSIS — R0602 Shortness of breath: Secondary | ICD-10-CM | POA: Diagnosis not present

## 2024-09-01 DIAGNOSIS — R55 Syncope and collapse: Secondary | ICD-10-CM | POA: Diagnosis not present

## 2024-09-01 DIAGNOSIS — M542 Cervicalgia: Secondary | ICD-10-CM | POA: Diagnosis not present

## 2024-09-02 DIAGNOSIS — R0683 Snoring: Secondary | ICD-10-CM | POA: Diagnosis not present

## 2024-09-02 DIAGNOSIS — I10 Essential (primary) hypertension: Secondary | ICD-10-CM | POA: Diagnosis not present

## 2024-09-02 DIAGNOSIS — G4719 Other hypersomnia: Secondary | ICD-10-CM | POA: Diagnosis not present

## 2024-09-09 DIAGNOSIS — J449 Chronic obstructive pulmonary disease, unspecified: Secondary | ICD-10-CM | POA: Diagnosis not present

## 2024-09-09 DIAGNOSIS — I10 Essential (primary) hypertension: Secondary | ICD-10-CM | POA: Diagnosis not present

## 2024-09-09 DIAGNOSIS — R002 Palpitations: Secondary | ICD-10-CM | POA: Diagnosis not present

## 2024-09-09 DIAGNOSIS — R0789 Other chest pain: Secondary | ICD-10-CM | POA: Diagnosis not present

## 2024-09-09 DIAGNOSIS — E1142 Type 2 diabetes mellitus with diabetic polyneuropathy: Secondary | ICD-10-CM | POA: Diagnosis not present

## 2024-09-14 DIAGNOSIS — R000 Tachycardia, unspecified: Secondary | ICD-10-CM | POA: Diagnosis not present

## 2024-09-14 DIAGNOSIS — R002 Palpitations: Secondary | ICD-10-CM | POA: Diagnosis not present

## 2024-09-14 DIAGNOSIS — R079 Chest pain, unspecified: Secondary | ICD-10-CM | POA: Diagnosis not present

## 2024-09-14 DIAGNOSIS — R0602 Shortness of breath: Secondary | ICD-10-CM | POA: Diagnosis not present

## 2024-09-15 DIAGNOSIS — E1169 Type 2 diabetes mellitus with other specified complication: Secondary | ICD-10-CM | POA: Diagnosis not present

## 2024-09-15 DIAGNOSIS — E1165 Type 2 diabetes mellitus with hyperglycemia: Secondary | ICD-10-CM | POA: Diagnosis not present

## 2024-09-15 DIAGNOSIS — R002 Palpitations: Secondary | ICD-10-CM | POA: Diagnosis not present

## 2024-09-15 DIAGNOSIS — R5383 Other fatigue: Secondary | ICD-10-CM | POA: Diagnosis not present

## 2024-09-15 DIAGNOSIS — E785 Hyperlipidemia, unspecified: Secondary | ICD-10-CM | POA: Diagnosis not present

## 2024-09-15 DIAGNOSIS — I10 Essential (primary) hypertension: Secondary | ICD-10-CM | POA: Diagnosis not present

## 2024-09-20 DIAGNOSIS — G4719 Other hypersomnia: Secondary | ICD-10-CM | POA: Diagnosis not present

## 2024-09-20 DIAGNOSIS — I10 Essential (primary) hypertension: Secondary | ICD-10-CM | POA: Diagnosis not present

## 2024-09-20 DIAGNOSIS — R0683 Snoring: Secondary | ICD-10-CM | POA: Diagnosis not present

## 2024-10-21 ENCOUNTER — Ambulatory Visit: Admitting: Internal Medicine

## 2024-10-21 ENCOUNTER — Encounter: Payer: Self-pay | Admitting: Internal Medicine

## 2024-10-21 VITALS — BP 133/85 | HR 61 | Ht 64.0 in | Wt 107.0 lb

## 2024-10-21 DIAGNOSIS — J449 Chronic obstructive pulmonary disease, unspecified: Secondary | ICD-10-CM | POA: Insufficient documentation

## 2024-10-21 DIAGNOSIS — F17210 Nicotine dependence, cigarettes, uncomplicated: Secondary | ICD-10-CM | POA: Diagnosis not present

## 2024-10-21 DIAGNOSIS — J4489 Other specified chronic obstructive pulmonary disease: Secondary | ICD-10-CM | POA: Insufficient documentation

## 2024-10-21 MED ORDER — BREZTRI AEROSPHERE 160-9-4.8 MCG/ACT IN AERO: 2.0000 | INHALATION_SPRAY | Freq: Two times a day (BID) | RESPIRATORY_TRACT | Status: AC

## 2024-10-21 MED ORDER — ALBUTEROL SULFATE HFA 108 (90 BASE) MCG/ACT IN AERS: INHALATION_SPRAY | RESPIRATORY_TRACT | 11 refills | Status: AC

## 2024-10-21 MED ORDER — BUDESONIDE-FORMOTEROL FUMARATE 160-4.5 MCG/ACT IN AERO: INHALATION_SPRAY | RESPIRATORY_TRACT | 12 refills | Status: AC

## 2024-10-21 NOTE — Progress Notes (Signed)
 "   Lacey May, female    DOB: 10/24/1970    MRN: [id number]   Brief patient profile:  54  yobf  active smoker since l grew up in group home very sickly stomach problem and asthma  since 20s  referred to pulmonary clinic in [place]  10/21/2024 by Lacey May  for dyspnea  with emphysema on LDST   CT 09/01/24 Moderate upper lobe predominant centrilobular and paraseptal emphysema. No nodules   Pt last seen by PCCM 2020 for GIB  History of Present Illness  10/21/2024  Pulmonary/ 1st May eval/ Lacey May / Lacey May on no maint rx  Chief Complaint  Patient presents with   Establish Care    Shob and heart palp - nocturnal hypoemia - asthma, copd , Emphysema , gets pna a lot  , co2 build up , had full body blood transfusion and has had problems every since   Dyspnea:  first noticed in 30's worse x  last 3y   / on feet constantly at [employer] Cough: 1st thing in am x 45 m to an hour to clear it/ advair made it worse > thick white  Sleep: flat bed / sleeps on teddy bear and does fine most nights until time to start her day SABA use: none on day of ov, thinks they make her worse x over a year off 02: none     No obvious day to day or daytime pattern/variability or assoc  or mucus plugs or hemoptysis or cp or chest tightness, subjective wheeze or overt sinus or hb symptoms.    Also denies any obvious fluctuation of symptoms with weather or environmental changes or other aggravating or alleviating factors except as outlined above   No unusual exposure hx or h/o childhood pna/ asthma or knowledge of premature birth.  Current Allergies, Complete Past Medical History, Past Surgical History, Family History, and Social History were reviewed in [employer] record.  ROS  The following are not active complaints unless bolded Hoarseness, sore throat, dysphagia, dental problems, itching, sneezing,  nasal congestion or discharge of excess mucus or purulent secretions,  ear ache,   fever, chills, sweats, unintended wt loss or wt gain, classically pleuritic or exertional cp,  orthopnea pnd or arm/hand swelling  or leg swelling, presyncope, palpitations, abdominal pain, anorexia, nausea, vomiting, diarrhea  or change in bowel habits or change in bladder habits, change in stools or change in urine, dysuria, hematuria,  rash, arthralgias, visual complaints, headache, numbness, weakness or ataxia or problems with walking or coordination,  change in mood or  memory. Chronic fatigue             Outpatient Medications Prior to Visit -  - NOTE:   Unable to verify as accurately reflecting what pt takes    Medication Sig Dispense Refill   Accu-Chek Softclix Lancets lancets 3 (three) times daily.     Acetaminophen  325 MG CAPS Take by mouth.     albuterol  (VENTOLIN  HFA) 108 (90 Base) MCG/ACT inhaler Inhale 2 puffs into the lungs every 4 (four) hours as needed for wheezing or shortness of breath. 1 each 3   ARIPiprazole (ABILIFY) 5 MG tablet Take 5 mg by mouth daily.     atorvastatin (LIPITOR) 20 MG tablet Take 20 mg by mouth daily.     budesonide  (PULMICORT ) 0.5 MG/2ML nebulizer solution Take 2 mLs (0.5 mg total) by nebulization daily. 2 mL 1   clonazePAM (KLONOPIN) 0.5 MG tablet Take by  mouth.     cyanocobalamin  1000 MCG tablet Take 1,000 mcg by mouth 2 (two) times daily.     cyclobenzaprine  (FLEXERIL ) 10 MG tablet Take 1 tablet by mouth 2 (two) times daily as needed.     diclofenac Sodium (VOLTAREN) 1 % GEL Apply topically 2 (two) times daily.     diltiazem (TIAZAC) 240 MG 24 hr capsule Take by mouth.     DULoxetine (CYMBALTA) 60 MG capsule Take 60 mg by mouth daily.     esomeprazole (NEXIUM) 40 MG capsule Take by mouth.     ferrous sulfate  325 (65 FE) MG EC tablet Take 1 tablet (325 mg total) by mouth 2 (two) times daily. 60 tablet 3   furosemide  (LASIX ) 20 MG tablet Take 1 tablet (20 mg total) by mouth daily. 30 tablet 0   gabapentin  (NEURONTIN ) 400 MG capsule 1  capsule Orally twice a day     glucose blood test strip in the morning, at noon, and at bedtime.     guaiFENesin-codeine 100-10 MG/5ML syrup Take 5 mLs by mouth every 6 (six) hours as needed.     Ipratropium-Albuterol  (COMBIVENT) 20-100 MCG/ACT AERS respimat Inhale 1 puff into the lungs every 6 (six) hours. 4 g 2   lidocaine  (LIDODERM ) 5 % SMARTSIG:1 Patch(s) Topical     losartan  (COZAAR ) 25 MG tablet Take 1 tablet (25 mg total) by mouth daily. 30 tablet 5   metFORMIN (GLUCOPHAGE) 1000 MG tablet Take 1,000 mg by mouth 2 (two) times daily.     mometasone (NASONEX) 50 MCG/ACT nasal spray Place 2 sprays into the nose daily.     Multiple Vitamin (MULTIVITAMIN) tablet Take 1 tablet by mouth daily.     nicotine  (NICODERM CQ  - DOSED IN MG/24 HOURS) 21 mg/24hr patch Place onto the skin.     nitroGLYCERIN  (NITRODUR - DOSED IN MG/24 HR) 0.2 mg/hr patch PLACE 1 PATCH ONTO THE SKIN ONCE DAILY. LEAVE PATCHES ON FOR 12 TO 14 HOURS THEN REMOVE FOR 10 TO 12 HOURS. PRIOR TO APPLYING THE NEXT PATCH.     olmesartan (BENICAR) 40 MG tablet Take 40 mg by mouth daily.     ondansetron  (ZOFRAN -ODT) 4 MG disintegrating tablet Take 1 tablet (4 mg total) by mouth every 8 (eight) hours as needed for nausea or vomiting. 20 tablet 0   pantoprazole  (PROTONIX ) 40 MG tablet Take by mouth.     promethazine  (PHENERGAN ) 25 MG tablet Take 25 mg by mouth every 6 (six) hours as needed.      propranolol (INDERAL) 10 MG tablet Take 10 mg by mouth 2 (two) times daily.     Pseudoeph-Doxylamine-DM-APAP (NYQUIL PO) Take by mouth.     rizatriptan (MAXALT) 10 MG tablet Take by mouth.     sucralfate (CARAFATE) 1 g tablet Take by mouth.     traMADol (ULTRAM) 50 MG tablet Take 50 mg by mouth 2 (two) times daily as needed.     WIXELA INHUB 250-50 MCG/ACT AEPB Inhale 1 puff into the lungs 2 (two) times daily.     No facility-administered medications prior to visit.    Past Medical History:  Diagnosis Date   Asthma    COPD (chronic  obstructive pulmonary disease) (HCC)    Diabetes mellitus without complication (HCC)    Fibromyalgia    Hypertension    MS (multiple sclerosis)    Restrictive airway disease    Thyroid disease       Objective:     BP 133/85  Pulse 61   Ht 5' 4 (1.626 m)   Wt 107 lb (48.5 kg)   SpO2 96% Comment: ra  BMI 18.37 kg/m   SpO2: 96 % (ra) amb talkative thin bf nad  HEENT : Oropharynx  clear/ edentulous   Nasal turbinates mild edema    NECK :  without  apparent JVD/ palpable Nodes/TM    LUNGS: no acc muscle use,  Mild barrel  contour chest wall with bilateral  Distant exp rhonchi wheeze and cough on  exp maneuvers  and mild  Hyperresonant  to  percussion bilaterally     CV:  RRR  no s3 or murmur or increase in P2, and no edema   ABD:  soft and nontender   MS:  Nl gait/ ext warm without deformities Or obvious joint restrictions  calf tenderness, cyanosis or clubbing     SKIN: warm and dry without lesions    NEURO:  alert, approp, nl sensorium with  no motor or cerebellar deficits apparent.        Assessment   Assessment & Plan Asthmatic bronchitis , chronic (HCC) Onset of symptoms in her 30s/ active smoker  -CT 09/01/24 Moderate upper lobe predominant centrilobular and paraseptal emphysema - 10/21/2024   Walked on RA   x  3  lap(s) =  approx 450  ft  @ mod pace, stopped due to end of study  with lowest 02 sats 92%  but up to 97% before stopped with minimal sob  -10/21/2024  After extensive coaching inhaler device,  effectiveness =    75% from a baseline of 25 % so try symbicort  160 2bid and f/u with pfts   She is likely mod copd with GOLD E features/ AB but her insurance won't cover the triple rx in one device so rec start with symbicort  160 2bid until returns for pft's then add spiriva 2.5  x 2 each if not satisfied with the benefit of just the symbicort  and prn saba  Re SABA :  I spent extra time with pt today reviewing appropriate use of albuterol  for prn use on  exertion with the following points: 1) saba is for relief of sob that does not improve by walking a slower pace or resting but rather if the pt does not improve after trying this first. 2) If the pt is convinced, as many are, that saba helps recover from activity faster then it's easy to tell if this is the case by re-challenging : ie stop, take the inhaler, then p 5 minutes try the exact same activity (intensity of workload) that just caused the symptoms and see if they are substantially diminished or not after saba 3) if there is an activity that reproducibly causes the symptoms, try the saba 15 min before the activity on alternate days   If in fact the saba really does help, then fine to continue to use it prn but advised may need to look closer at the maintenance regimen being used to achieve better control of airways disease with exertion.    Cigarette smoker Counseled re importance of smoking cessation but did not meet time criteria for separate billing    Low-dose CT lung cancer screening is recommended for patients who are 35-50 years of age with a 20+ pack-year history of smoking and who are currently smoking or quit <=15 years ago. No coughing up blood  No unintentional weight loss of > 15 pounds in the last 6 months - pt is eligible for  scanning yearly until 55 y p she quits smoking and already on the program for repeating the study q fall     Each maintenance medication was reviewed in detail including emphasizing most importantly the difference between maintenance and prns and under what circumstances the prns are to be triggered using an action plan format where appropriate.  Total time for H and P, chart review, counseling, reviewing hfa device(s) , directly observing portions of ambulatory 02 saturation study/ and generating customized AVS unique to this May visit / same day charting = 65 min new pt eval                  AVS  Patient Instructions  Plan A = Automatic =  Always=    Symbicort  (Breyna / breztri ) Take 2 puffs first thing in am and then another 2 puffs about 12 hours later.    Work on inhaler technique:  relax and gently blow all the way out then take a nice smooth full deep breath back in, triggering the inhaler at same time you start breathing in.  Hold breath in for at least  5 seconds if you can. Blow out symbicort  (breyna , breztri )  thru nose. Rinse and gargle with water when done.  If mouth or throat bother you at all,  try brushing teeth/gums/tongue with arm and hammer toothpaste/ make a slurry and gargle and spit out.   Plan B = Backup (to supplement plan A, not to replace it) Use your albuterol  inhaler as a rescue medication to be used if you can't catch your breath by resting or slowing your pace  or doing a relaxed purse lip breathing pattern.  - The less you use it, the better it will work when you need it. - Ok to use the inhaler up to 2 puffs  every 4 hours if you must but call for appointment if use goes up over your usual need - Don't leave home without it !!  (think of it like the spare tire or starter fluid for your car)   Also  Ok to try albuterol  15 min before an activity (on alternating days)  that you know would usually make you short of breath and see if it makes any difference and if makes none then don't take albuterol  after activity unless you can't catch your breath as this means it's the resting that helps, not the albuterol .      The key is to stop smoking completely before smoking completely stops you!  Please schedule a follow up May visit in 6-8  weeks, call sooner if needed PFTs on return  - bring all active respiratory medications on return           Ozell America, MD 10/21/2024      "

## 2024-10-21 NOTE — Patient Instructions (Addendum)
 Plan A = Automatic = Always=    Symbicort  (Breyna / breztri ) Take 2 puffs first thing in am and then another 2 puffs about 12 hours later.    Work on inhaler technique:  relax and gently blow all the way out then take a nice smooth full deep breath back in, triggering the inhaler at same time you start breathing in.  Hold breath in for at least  5 seconds if you can. Blow out symbicort  (breyna , breztri )  thru nose. Rinse and gargle with water when done.  If mouth or throat bother you at all,  try brushing teeth/gums/tongue with arm and hammer toothpaste/ make a slurry and gargle and spit out.   Plan B = Backup (to supplement plan A, not to replace it) Use your albuterol  inhaler as a rescue medication to be used if you can't catch your breath by resting or slowing your pace  or doing a relaxed purse lip breathing pattern.  - The less you use it, the better it will work when you need it. - Ok to use the inhaler up to 2 puffs  every 4 hours if you must but call for appointment if use goes up over your usual need - Don't leave home without it !!  (think of it like the spare tire or starter fluid for your car)   Also  Ok to try albuterol  15 min before an activity (on alternating days)  that you know would usually make you short of breath and see if it makes any difference and if makes none then don't take albuterol  after activity unless you can't catch your breath as this means it's the resting that helps, not the albuterol .      The key is to stop smoking completely before smoking completely stops you!  Please schedule a follow up office visit in 6-8  weeks, call sooner if needed PFTs on return  - bring all active respiratory medications on return

## 2024-10-21 NOTE — Assessment & Plan Note (Addendum)
 Onset of symptoms in her 30s/ active smoker  -CT 09/01/24 Moderate upper lobe predominant centrilobular and paraseptal emphysema - 10/21/2024   Walked on RA   x  3  lap(s) =  approx 450  ft  @ mod pace, stopped due to end of study  with lowest 02 sats 92%  but up to 97% before stopped with minimal sob  -10/21/2024  After extensive coaching inhaler device,  effectiveness =    75% from a baseline of 25 % so try symbicort  160 2bid and f/u with pfts   She is likely mod copd with GOLD E features/ AB but her insurance won't cover the triple rx in one device so rec start with symbicort  160 2bid until returns for pft's then add spiriva 2.5  x 2 each if not satisfied with the benefit of just the symbicort  and prn saba  Re SABA :  I spent extra time with pt today reviewing appropriate use of albuterol  for prn use on exertion with the following points: 1) saba is for relief of sob that does not improve by walking a slower pace or resting but rather if the pt does not improve after trying this first. 2) If the pt is convinced, as many are, that saba helps recover from activity faster then it's easy to tell if this is the case by re-challenging : ie stop, take the inhaler, then p 5 minutes try the exact same activity (intensity of workload) that just caused the symptoms and see if they are substantially diminished or not after saba 3) if there is an activity that reproducibly causes the symptoms, try the saba 15 min before the activity on alternate days   If in fact the saba really does help, then fine to continue to use it prn but advised may need to look closer at the maintenance regimen being used to achieve better control of airways disease with exertion.

## 2024-10-21 NOTE — Assessment & Plan Note (Addendum)
 Counseled re importance of smoking cessation but did not meet time criteria for separate billing    Low-dose CT lung cancer screening is recommended for patients who are 74-55 years of age with a 20+ pack-year history of smoking and who are currently smoking or quit <=15 years ago. No coughing up blood  No unintentional weight loss of > 15 pounds in the last 6 months - pt is eligible for scanning yearly until 89 y p she quits smoking and already on the program for repeating the study q fall     Each maintenance medication was reviewed in detail including emphasizing most importantly the difference between maintenance and prns and under what circumstances the prns are to be triggered using an action plan format where appropriate.  Total time for H and P, chart review, counseling, reviewing hfa device(s) , directly observing portions of ambulatory 02 saturation study/ and generating customized AVS unique to this office visit / same day charting = 65 min new pt eval

## 2024-10-22 ENCOUNTER — Telehealth: Payer: Self-pay

## 2024-10-22 NOTE — Telephone Encounter (Signed)
 Copied from CRM #[id number]. Topic: Clinical - Medication Question >> Oct 21, 2024  5:53 PM Rozanna G wrote: Reason for CRM: pt called upset because Dr. Darlean put her on budesonide -formoterol  (SYMBICORT ) 160-4.5 MCG/ACT inhaler she wants budesonide -glycopyrrolate-formoterol  (BREZTRI  AEROSPHERE) 160-9-4.8 MCG/ACT AERO inhaler, stated she wants to use Breztri . She would like a call about this, because she does not want to use Symbicort  because it does not work for her.  Atc x1 vm not set up
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
## 2024-10-27 ENCOUNTER — Ambulatory Visit (HOSPITAL_COMMUNITY)
Admission: RE | Admit: 2024-10-27 | Discharge: 2024-10-27 | Disposition: A | Discharge status: Home / Self Care | Source: Ambulatory Visit | Attending: Internal Medicine | Admitting: Internal Medicine

## 2024-10-27 DIAGNOSIS — J4489 Other specified chronic obstructive pulmonary disease: Secondary | ICD-10-CM | POA: Insufficient documentation

## 2024-10-27 LAB — PULMONARY FUNCTION TEST
DL/VA % pred: 64 %
DL/VA: 2.75 ml/min/mmHg/L
DLCO unc % pred: 65 %
DLCO unc: 13.43 ml/min/mmHg
FEF 25-75 Post: 0.73 L/s
FEF 25-75 Pre: 0.78 L/s
FEF2575-%Change-Post: -6 %
FEF2575-%Pred-Post: 28 %
FEF2575-%Pred-Pre: 30 %
FEV1-%Change-Post: 0 %
FEV1-%Pred-Post: 71 %
FEV1-%Pred-Pre: 72 %
FEV1-Post: 1.91 L
FEV1-Pre: 1.93 L
FEV1FVC-%Change-Post: -2 %
FEV1FVC-%Pred-Pre: 66 %
FEV6-%Change-Post: 0 %
FEV6-%Pred-Post: 99 %
FEV6-%Pred-Pre: 99 %
FEV6-Post: 3.28 L
FEV6-Pre: 3.28 L
FEV6FVC-%Change-Post: -2 %
FEV6FVC-%Pred-Post: 91 %
FEV6FVC-%Pred-Pre: 94 %
FVC-%Change-Post: 1 %
FVC-%Pred-Post: 109 %
FVC-%Pred-Pre: 107 %
FVC-Post: 3.71 L
FVC-Pre: 3.64 L
Post FEV1/FVC ratio: 52 %
Post FEV6/FVC ratio: 88 %
Pre FEV1/FVC ratio: 53 %
Pre FEV6/FVC Ratio: 91 %
RV % pred: 177 %
RV: 3.26 L
TLC % pred: 140 %
TLC: 7.01 L

## 2024-10-27 MED ORDER — ALBUTEROL SULFATE (2.5 MG/3ML) 0.083% IN NEBU
2.5000 mg | INHALATION_SOLUTION | Freq: Once | RESPIRATORY_TRACT | Status: AC
  Administered 2024-10-27: 2.5 mg via RESPIRATORY_TRACT

## 2024-10-28 ENCOUNTER — Other Ambulatory Visit: Payer: Self-pay | Admitting: Physician Assistant

## 2024-10-28 DIAGNOSIS — Z1231 Encounter for screening mammogram for malignant neoplasm of breast: Secondary | ICD-10-CM

## 2024-11-02 ENCOUNTER — Ambulatory Visit: Payer: Self-pay | Admitting: Internal Medicine

## 2024-11-02 ENCOUNTER — Encounter: Payer: Self-pay | Admitting: Internal Medicine

## 2024-11-03 NOTE — Telephone Encounter (Signed)
 Patient scheduled.

## 2024-11-03 NOTE — Telephone Encounter (Signed)
 Please call and scd 6-8 week f/u with wert

## 2024-12-02 ENCOUNTER — Ambulatory Visit: Admitting: Internal Medicine
